# Patient Record
Sex: Female | Born: 1985 | Race: Asian | Hispanic: No | Marital: Married | Smoking: Never smoker
Health system: Southern US, Community
[De-identification: ages and names within clinical notes are randomized; demographics above are authoritative.]

## PROBLEM LIST (undated history)

## (undated) DIAGNOSIS — R519 Headache, unspecified: Secondary | ICD-10-CM

## (undated) DIAGNOSIS — G43909 Migraine, unspecified, not intractable, without status migrainosus: Secondary | ICD-10-CM

## (undated) DIAGNOSIS — M797 Fibromyalgia: Secondary | ICD-10-CM

## (undated) DIAGNOSIS — K219 Gastro-esophageal reflux disease without esophagitis: Secondary | ICD-10-CM

## (undated) DIAGNOSIS — D649 Anemia, unspecified: Secondary | ICD-10-CM

## (undated) DIAGNOSIS — R51 Headache: Secondary | ICD-10-CM

## (undated) DIAGNOSIS — E876 Hypokalemia: Secondary | ICD-10-CM

## (undated) DIAGNOSIS — R140 Abdominal distension (gaseous): Secondary | ICD-10-CM

## (undated) DIAGNOSIS — D696 Thrombocytopenia, unspecified: Secondary | ICD-10-CM

## (undated) DIAGNOSIS — K649 Unspecified hemorrhoids: Secondary | ICD-10-CM

## (undated) DIAGNOSIS — G4700 Insomnia, unspecified: Secondary | ICD-10-CM

## (undated) HISTORY — PX: UPPER GI ENDOSCOPY: SHX6162

## (undated) HISTORY — DX: Thrombocytopenia, unspecified: D69.6

## (undated) HISTORY — DX: Hypokalemia: E87.6

## (undated) HISTORY — DX: Unspecified hemorrhoids: K64.9

## (undated) HISTORY — DX: Migraine, unspecified, not intractable, without status migrainosus: G43.909

## (undated) HISTORY — DX: Anemia, unspecified: D64.9

---

## 2004-05-30 ENCOUNTER — Emergency Department (HOSPITAL_COMMUNITY): Admission: EM | Admit: 2004-05-30 | Discharge: 2004-05-31 | Payer: Self-pay | Admitting: Emergency Medicine

## 2004-10-05 HISTORY — PX: DILATION AND CURETTAGE OF UTERUS: SHX78

## 2005-01-12 ENCOUNTER — Inpatient Hospital Stay (HOSPITAL_COMMUNITY): Admission: AD | Admit: 2005-01-12 | Discharge: 2005-01-12 | Payer: Self-pay | Admitting: Family Medicine

## 2005-03-17 ENCOUNTER — Ambulatory Visit: Payer: Self-pay | Admitting: *Deleted

## 2005-03-17 ENCOUNTER — Ambulatory Visit (HOSPITAL_COMMUNITY): Admission: RE | Admit: 2005-03-17 | Discharge: 2005-03-17 | Payer: Self-pay | Admitting: *Deleted

## 2005-03-19 ENCOUNTER — Encounter: Admission: RE | Admit: 2005-03-19 | Discharge: 2005-06-17 | Payer: Self-pay | Admitting: *Deleted

## 2005-04-23 ENCOUNTER — Ambulatory Visit (HOSPITAL_COMMUNITY): Admission: RE | Admit: 2005-04-23 | Discharge: 2005-04-23 | Payer: Self-pay | Admitting: *Deleted

## 2005-06-25 ENCOUNTER — Inpatient Hospital Stay (HOSPITAL_COMMUNITY): Admission: AD | Admit: 2005-06-25 | Discharge: 2005-06-25 | Payer: Self-pay | Admitting: Obstetrics and Gynecology

## 2005-06-25 ENCOUNTER — Ambulatory Visit: Payer: Self-pay | Admitting: *Deleted

## 2005-08-03 ENCOUNTER — Ambulatory Visit (HOSPITAL_COMMUNITY): Admission: RE | Admit: 2005-08-03 | Discharge: 2005-08-03 | Payer: Self-pay | Admitting: *Deleted

## 2005-08-10 ENCOUNTER — Ambulatory Visit: Payer: Self-pay | Admitting: *Deleted

## 2005-08-17 ENCOUNTER — Ambulatory Visit: Payer: Self-pay | Admitting: Family Medicine

## 2005-08-24 ENCOUNTER — Ambulatory Visit (HOSPITAL_COMMUNITY): Admission: RE | Admit: 2005-08-24 | Discharge: 2005-08-24 | Payer: Self-pay | Admitting: *Deleted

## 2005-08-24 ENCOUNTER — Ambulatory Visit: Payer: Self-pay | Admitting: Obstetrics & Gynecology

## 2005-08-31 ENCOUNTER — Ambulatory Visit: Payer: Self-pay | Admitting: *Deleted

## 2005-08-31 ENCOUNTER — Inpatient Hospital Stay (HOSPITAL_COMMUNITY): Admission: AD | Admit: 2005-08-31 | Discharge: 2005-09-03 | Payer: Self-pay | Admitting: Obstetrics & Gynecology

## 2005-09-01 ENCOUNTER — Encounter (INDEPENDENT_AMBULATORY_CARE_PROVIDER_SITE_OTHER): Payer: Self-pay | Admitting: Specialist

## 2005-09-07 ENCOUNTER — Inpatient Hospital Stay (HOSPITAL_COMMUNITY): Admission: AD | Admit: 2005-09-07 | Discharge: 2005-09-07 | Payer: Self-pay | Admitting: *Deleted

## 2009-04-29 ENCOUNTER — Emergency Department (HOSPITAL_COMMUNITY): Admission: EM | Admit: 2009-04-29 | Discharge: 2009-04-29 | Payer: Self-pay | Admitting: Family Medicine

## 2010-03-18 ENCOUNTER — Emergency Department (HOSPITAL_COMMUNITY): Admission: EM | Admit: 2010-03-18 | Discharge: 2010-03-18 | Payer: Self-pay | Admitting: Emergency Medicine

## 2010-06-03 ENCOUNTER — Encounter: Admission: RE | Admit: 2010-06-03 | Discharge: 2010-06-03 | Payer: Self-pay | Admitting: Infectious Diseases

## 2010-07-08 ENCOUNTER — Ambulatory Visit: Payer: Self-pay | Admitting: Internal Medicine

## 2010-07-08 DIAGNOSIS — IMO0001 Reserved for inherently not codable concepts without codable children: Secondary | ICD-10-CM

## 2010-07-08 DIAGNOSIS — M25519 Pain in unspecified shoulder: Secondary | ICD-10-CM

## 2010-07-08 DIAGNOSIS — R51 Headache: Secondary | ICD-10-CM

## 2010-07-08 DIAGNOSIS — R209 Unspecified disturbances of skin sensation: Secondary | ICD-10-CM

## 2010-07-08 DIAGNOSIS — R519 Headache, unspecified: Secondary | ICD-10-CM | POA: Insufficient documentation

## 2010-07-09 LAB — CONVERTED CEMR LAB: Vit D, 25-Hydroxy: 17 ng/mL — ABNORMAL LOW (ref 30–89)

## 2010-07-10 LAB — CONVERTED CEMR LAB
Sed Rate: 9 mm/hr (ref 0–22)
TSH: 0.87 microintl units/mL (ref 0.35–5.50)
Total CK: 89 units/L (ref 7–177)

## 2010-07-23 ENCOUNTER — Telehealth (INDEPENDENT_AMBULATORY_CARE_PROVIDER_SITE_OTHER): Payer: Self-pay | Admitting: *Deleted

## 2010-11-04 NOTE — Assessment & Plan Note (Signed)
Summary: NEW TO ESTAB///SPH   Vital Signs:  Patient profile:   25 year old female Height:      64 inches Weight:      126.8 pounds BMI:     21.84 Temp:     98.4 degrees F oral Pulse rate:   88 / minute Resp:     14 per minute BP sitting:   104 / 62  (left arm) Cuff size:   large  Vitals Entered By: Shonna Chock CMA (July 08, 2010 1:53 PM) CC: New patient establish: Discuss some symptoms, Headaches, Shoulder pain Comments Patient was taking Zonisamide 25mg  4 by mouth at bedtime and Metaxaline 800mg  1 by mouth 3-4 x as needed. Patient stopped x 1 month ago.   CC:  New patient establish: Discuss some symptoms, Headaches, and Shoulder pain.  History of Present Illness: Headaches      This is a 25 year old woman who presents with  a new type of headaches for 1-2 weeks.  The patient reports nausea, sinus pain, sinus pressure, photophobia, and phonophobia, but denies vomiting, sweats, tearing of eyes, and nasal congestion.  The headache is described as intermittent and dull.  The location of the pain is diffuse.  She also has  neck pain.  The patient denies the following high-risk features: fever, vision loss or change, focal weakness, and pain worse with exertion.  The headaches are precipitated by change in weather.  Prior treatment has included Zonisamide &  Metaxalone from Dr Catalina Lunger , Headache specialist  in Summer of 2011 for  daily headaches ( she had been taking Excedrin Migraine & OTC med with caffeine regularly) with throbbing "all over" with resolution of that type of headache. She stopped these 2 weeks ago because she is considering getting pregnant.No headaches with 1st pregnancy. Shoulder Pain      The patient also presents with Shoulder area  pain for several months.  The patient reports numbness, weakness, and tingling in RUE, but denies locking, swelling, and redness.  The pain is located in the right shoulder .  The pain began gradually and without injury.  The patient describes  the pain as intermittent, sharp to  burning.  The pain is better with NSAIDS ( see comment above concerning chronic daily headaches).  The pain is worse with activity.  PMH of low back treated with response by Chiropractry.  Preventive Screening-Counseling & Management  Alcohol-Tobacco     Smoking Status: never  Caffeine-Diet-Exercise     Does Patient Exercise: no  Current Medications (verified): 1)  None  Allergies (verified): No Known Drug Allergies  Past History:  Past Medical History: Headache, migraines  Past Surgical History: G1 P 1  Denies surgical history  Family History: Father: Living, HTN, High Cholesterol , Gout Mother: Living, colon cancer(resected), migraines Siblings: 3 sisters, 2 brothers. 1 bro : seizures  Social History: Occupation: Agricultural engineer Married Never Smoked Alcohol use-no Regular exercise-no Smoking Status:  never Does Patient Exercise:  no  Physical Exam  General:  well-nourished,in no acute distress; alert,appropriate and cooperative throughout examination Head:  Normocephalic and atraumatic without obvious abnormalities.  Eyes:  No corneal or conjunctival inflammation noted. EOMI. Perrla. Field of Vision grossly normal. Ears:  External ear exam shows no significant lesions or deformities.  Otoscopic examination reveals clear canals, tympanic membranes are intact bilaterally without bulging, retraction, inflammation or discharge. Hearing is grossly normal bilaterally. Nose:  External nasal examination shows no deformity or inflammation. Nasal mucosa are pink and moist  without lesions or exudates. Mouth:  Oral mucosa and oropharynx without lesions or exudates.  Teeth in good repair. Neck:  No deformities, masses, or tenderness noted. Full ROM Lungs:  Normal respiratory effort, chest expands symmetrically. Lungs are clear to auscultation, no crackles or wheezes. Heart:  Normal rate and regular rhythm. S1 and S2 normal without gallop, murmur,  click, rub .S4 Abdomen:  Bowel sounds positive,abdomen soft and non-tender without masses, organomegaly or hernias noted. Msk:  Slight scoliosis suggested; asymmetry of paraspinal muscles. Straightening of upper thoracic spine with loss of normal curvature  Pulses:  R and L carotid,radial,dorsalis pedis and posterior tibial pulses are full and equal bilaterally Extremities:  No clubbing, cyanosis, edema, or deformity noted with normal full range of motion of all joints.   Neurologic:    alert & oriented X3, cranial nerves II-XII intact, strength normal in all extremities, sensation intact to light touch over face , and DTRs symmetrical and normal.  Equivocal Tinel's LUE Skin:  Intact without suspicious lesions or rashes Cervical Nodes:  No lymphadenopathy noted Axillary Nodes:  No palpable lymphadenopathy Psych:  memory intact for recent and remote, normally interactive, and good eye contact.     Impression & Recommendations:  Problem # 1:  HEADACHE (ICD-784.0)  PMH of migraine & chronic daily headaches  Orders: Venipuncture (04540)  Her updated medication list for this problem includes:    Tramadol Hcl 50 Mg Tabs (Tramadol hcl) .Marland Kitchen... 1 every 6 hrs as needed for muscle pain  Problem # 2:  SHOULDER PAIN, RIGHT (ICD-719.41)  probably related to spinal curvature issues  Orders: T-Cervicle Spine 2-3 Views (98119JY) T-Thoracic Spine 2 Views (78295AO) Venipuncture (13086) TLB-Sedimentation Rate (ESR) (85652-ESR) Specimen Handling (57846)  Her updated medication list for this problem includes:    Tramadol Hcl 50 Mg Tabs (Tramadol hcl) .Marland Kitchen... 1 every 6 hrs as needed for muscle pain  Problem # 3:  PARESTHESIA (ICD-782.0)  possible cervical radiculopthy   Orders: T-Cervicle Spine 2-3 Views (96295MW) T-Thoracic Spine 2 Views (41324MW) Venipuncture (10272) TLB-TSH (Thyroid Stimulating Hormone) (84443-TSH) Specimen Handling (53664)  Complete Medication List: 1)  Tramadol Hcl 50 Mg  Tabs (Tramadol hcl) .Marland Kitchen.. 1 every 6 hrs as needed for muscle pain  Other Orders: TLB-CK Total Only(Creatine Kinase/CPK) (82550-CK) T-Vitamin D (25-Hydroxy) (40347-42595)  Patient Instructions: 1)  Discuss  your plans for pregnancy with your Ob AND the Headache Specialist to minimize risks. Prescriptions: TRAMADOL HCL 50 MG TABS (TRAMADOL HCL) 1 every 6 hrs as needed for muscle pain  #30 x 0   Entered and Authorized by:   Marga Melnick MD   Signed by:   Marga Melnick MD on 07/08/2010   Method used:   Print then Give to Patient   RxID:   [id number]

## 2010-11-04 NOTE — Progress Notes (Signed)
Summary: Vit D Rx  Phone Note From Pharmacy   Caller: Fax from [facility] [address] Summary of Call: Fax states: Patient needs a rx for Vitamin D. Please advise.  Initial call taken by: Harold Barban,  July 23, 2010 8:17 AM  Follow-up for Phone Call        I spoke with patient and informed her that the dose of Vit D that Dr.Hopper recommended is OTC, patient ok'd  Follow-up by: Shonna Chock CMA,  July 23, 2010 8:45 AM

## 2011-01-11 LAB — POCT URINALYSIS DIP (DEVICE)
Bilirubin Urine: NEGATIVE
Glucose, UA: NEGATIVE mg/dL
Hgb urine dipstick: NEGATIVE
Ketones, ur: NEGATIVE mg/dL
Nitrite: NEGATIVE
Protein, ur: NEGATIVE mg/dL
Specific Gravity, Urine: 1.01 (ref 1.005–1.030)
Urobilinogen, UA: 0.2 mg/dL (ref 0.0–1.0)
pH: 6 (ref 5.0–8.0)

## 2011-01-11 LAB — POCT I-STAT, CHEM 8
BUN: 12 mg/dL (ref 6–23)
Calcium, Ion: 1.24 mmol/L (ref 1.12–1.32)
Chloride: 104 mEq/L (ref 96–112)
Creatinine, Ser: 0.8 mg/dL (ref 0.4–1.2)
Glucose, Bld: 108 mg/dL — ABNORMAL HIGH (ref 70–99)
HCT: 44 % (ref 36.0–46.0)
Hemoglobin: 15 g/dL (ref 12.0–15.0)
Potassium: 4.2 mEq/L (ref 3.5–5.1)
Sodium: 139 mEq/L (ref 135–145)
TCO2: 26 mmol/L (ref 0–100)

## 2011-01-11 LAB — DIFFERENTIAL
Basophils Absolute: 0 10*3/uL (ref 0.0–0.1)
Basophils Relative: 1 % (ref 0–1)
Eosinophils Absolute: 0.1 10*3/uL (ref 0.0–0.7)
Eosinophils Relative: 2 % (ref 0–5)
Lymphocytes Relative: 40 % (ref 12–46)
Lymphs Abs: 1.9 10*3/uL (ref 0.7–4.0)
Monocytes Absolute: 0.2 10*3/uL (ref 0.1–1.0)
Monocytes Relative: 4 % (ref 3–12)
Neutro Abs: 2.6 10*3/uL (ref 1.7–7.7)
Neutrophils Relative %: 54 % (ref 43–77)

## 2011-01-11 LAB — TSH: TSH: 1.939 u[IU]/mL (ref 0.350–4.500)

## 2011-01-11 LAB — CBC
HCT: 41.5 % (ref 36.0–46.0)
Hemoglobin: 13.6 g/dL (ref 12.0–15.0)
MCHC: 32.8 g/dL (ref 30.0–36.0)
MCV: 82.7 fL (ref 78.0–100.0)
Platelets: 139 10*3/uL — ABNORMAL LOW (ref 150–400)
RBC: 5.03 MIL/uL (ref 3.87–5.11)
RDW: 13 % (ref 11.5–15.5)
WBC: 4.8 10*3/uL (ref 4.0–10.5)

## 2011-01-11 LAB — POCT PREGNANCY, URINE: Preg Test, Ur: NEGATIVE

## 2011-02-20 NOTE — Discharge Summary (Signed)
Tonya [name], Tonya [name]                  ACCOUNT NO.:  [id number]   MEDICAL RECORD NO.:  [id number]          PATIENT TYPE:  INP   LOCATION:  9125                          FACILITY:  [facility]   PHYSICIAN:  Lesly Dukes, M.D. DATE OF BIRTH:  [birth year]   DATE OF ADMISSION:  08/31/2005  DATE OF DISCHARGE:  09/03/2005                                 DISCHARGE SUMMARY   DISCHARGE DIAGNOSES:  1.  Intrauterine pregnancy term, delivered by normal spontaneous vaginal      delivery at 38 and 6 weeks.  2.  Retained products postpartum.  3.  Status post a dilatation and evacuation on September 01, 2005.  4.  Hemorrhoids.  5.  Constipation.   DISCHARGE MEDICATIONS:  1.  Ibuprofen 600 mg take one tablet q.6h. by mouth as needed for pain.  2.  Colace 100 mg take one tablet by mouth twice daily as needed for      constipation.  3.  Prenatal vitamins take one tablet by mouth daily while breast-feeding.  4.  Proctofoam HC per instructions for postpartum hemorrhoids.   This is a 25 year old G1 who presented at 39 weeks complaining of  contractions every two minutes and was admitted to L&D for active labor.  Patient went on to a normal spontaneous vaginal delivery under epidural  anesthesia with patient delivering a viable female with Apgars of 9 and 9.  Placenta was delivered spontaneously with a three vessel cord.  There were  some noted retained membranes which were ring forceps out as well as had a  bimanual and physical examination with an evacuation performed by Dr.  Mayford Knife.  Patient was noted her bleeding to be stopped and was doing well  status post delivery and was moved to mother/baby.  On postpartum day one  patient developed a fever of 101 and was started on gentamicin and  clindamycin.  Patient was noted to have fever increasing up to a Tmax of  104.  Due to her history of having retained products.  Patient was consented  and taken for a D&E on September 01, 2005 by Dr. Gavin Potters for retained  products of conception.  Please see the dictated op note on September 01, 2005.  Products of conception were removed adequately.  The patient was then  transferred to adult ICU for further care.  Patient did well and remained  afebrile status post procedure.  The patient's pain was well controlled as  well as her bleeding.  Patient received Methergine, Hemabate, and Pitocin  during the postoperative period.  Patient was then transferred to  mother/baby on November __________  and has done well, has remained  afebrile.  Her pain has been well tolerated.  Patient is noted to have  hemorrhoids as well as having some gas and constipation-related pain.  Patient was given stool softeners as well as a stimulant laxative prior to  discharge.  Patient will be receiving Depo Provera for contraception.  Patient will be breast-feeding her female infant.  Patient is  rubella non-immune and receive rubella vaccination prior to discharge.  Patient is  RPR negative, GBS negative.  Blood type __________ .  Patient has  reached maximum benefit of this hospitalization and will follow up in six  weeks at [facility].      Barth Kirks, M.D.    ______________________________  Lesly Dukes, M.D.    MB/MEDQ  D:  09/03/2005  T:  09/03/2005  Job:  [id number]

## 2011-02-20 NOTE — Op Note (Signed)
NAMEBAYLIN, [name]                  ACCOUNT NO.:  [id number]   MEDICAL RECORD NO.:  [id number]          PATIENT TYPE:  INP   LOCATION:  9125                          FACILITY:  [facility]   PHYSICIAN:  Conni Elliot, M.D.DATE OF BIRTH:  09-Feb-1986   DATE OF PROCEDURE:  09/01/2005  DATE OF DISCHARGE:  09/03/2005                                 OPERATIVE REPORT   PREOPERATIVE DIAGNOSIS:  Retained products of conception.   POSTOPERATIVE DIAGNOSIS:  Retained products of conception.   OPERATION:  Dilatation and evacuation.   OPERATOR:  Conni Elliot, M.D.   ANESTHESIA:  MAC.   PROCEDURE:  After placing the patient under MAC anesthesia, the patient was  supine in the dorsal supine position.  Perineum and vagina were prepped with  a Betadine solution.  A weighted speculum was placed in the posterior  vagina.  The anterior cervix was grasped.  The uterus was evacuated with  suction and sharp curettage.  Retained products of conception were  identified and sent to pathology.  Instrument and sponge count correct.           ______________________________  Conni Elliot, M.D.     ASG/MEDQ  D:  10/15/2005  T:  10/15/2005  Job:  [id number]

## 2011-07-17 ENCOUNTER — Encounter: Payer: Self-pay | Admitting: Gastroenterology

## 2011-07-17 ENCOUNTER — Inpatient Hospital Stay (INDEPENDENT_AMBULATORY_CARE_PROVIDER_SITE_OTHER)
Admission: RE | Admit: 2011-07-17 | Discharge: 2011-07-17 | Disposition: A | Discharge status: Home / Self Care | Payer: BC Managed Care – PPO | Source: Ambulatory Visit | Attending: Emergency Medicine | Admitting: Emergency Medicine

## 2011-07-17 ENCOUNTER — Emergency Department (HOSPITAL_COMMUNITY)
Admission: EM | Admit: 2011-07-17 | Discharge: 2011-07-18 | Disposition: A | Discharge status: Home / Self Care | Payer: BC Managed Care – PPO | Attending: Emergency Medicine | Admitting: Emergency Medicine

## 2011-07-17 DIAGNOSIS — R109 Unspecified abdominal pain: Secondary | ICD-10-CM

## 2011-07-17 DIAGNOSIS — R142 Eructation: Secondary | ICD-10-CM | POA: Insufficient documentation

## 2011-07-17 DIAGNOSIS — R1032 Left lower quadrant pain: Secondary | ICD-10-CM | POA: Insufficient documentation

## 2011-07-17 DIAGNOSIS — K5900 Constipation, unspecified: Secondary | ICD-10-CM | POA: Insufficient documentation

## 2011-07-17 DIAGNOSIS — R141 Gas pain: Secondary | ICD-10-CM | POA: Insufficient documentation

## 2011-07-17 DIAGNOSIS — R110 Nausea: Secondary | ICD-10-CM | POA: Insufficient documentation

## 2011-07-17 DIAGNOSIS — K644 Residual hemorrhoidal skin tags: Secondary | ICD-10-CM | POA: Insufficient documentation

## 2011-07-17 LAB — URINALYSIS, ROUTINE W REFLEX MICROSCOPIC
Bilirubin Urine: NEGATIVE
Glucose, UA: NEGATIVE mg/dL
Hgb urine dipstick: NEGATIVE
Ketones, ur: NEGATIVE mg/dL
Leukocytes, UA: NEGATIVE
Nitrite: NEGATIVE
Protein, ur: NEGATIVE mg/dL
Specific Gravity, Urine: 1.019 (ref 1.005–1.030)
Urobilinogen, UA: 0.2 mg/dL (ref 0.0–1.0)
pH: 7 (ref 5.0–8.0)

## 2011-07-17 LAB — POCT PREGNANCY, URINE
Preg Test, Ur: NEGATIVE
Preg Test, Ur: NEGATIVE

## 2011-07-17 LAB — POCT I-STAT, CHEM 8
BUN: 10 mg/dL (ref 6–23)
Calcium, Ion: 1.2 mmol/L (ref 1.12–1.32)
Chloride: 102 mEq/L (ref 96–112)
Creatinine, Ser: 0.9 mg/dL (ref 0.50–1.10)
Glucose, Bld: 99 mg/dL (ref 70–99)
HCT: 40 % (ref 36.0–46.0)
Hemoglobin: 13.6 g/dL (ref 12.0–15.0)
Potassium: 4.2 mEq/L (ref 3.5–5.1)
Sodium: 139 mEq/L (ref 135–145)
TCO2: 26 mmol/L (ref 0–100)

## 2011-07-17 LAB — POCT URINALYSIS DIP (DEVICE)
Bilirubin Urine: NEGATIVE
Glucose, UA: NEGATIVE mg/dL
Hgb urine dipstick: NEGATIVE
Ketones, ur: NEGATIVE mg/dL
Leukocytes, UA: NEGATIVE
Nitrite: NEGATIVE
Protein, ur: NEGATIVE mg/dL
Specific Gravity, Urine: 1.015 (ref 1.005–1.030)
Urobilinogen, UA: 0.2 mg/dL (ref 0.0–1.0)
pH: 8.5 — ABNORMAL HIGH (ref 5.0–8.0)

## 2011-07-18 ENCOUNTER — Emergency Department (HOSPITAL_COMMUNITY): Payer: BC Managed Care – PPO

## 2011-07-18 LAB — WET PREP, GENITAL
Trich, Wet Prep: NONE SEEN
Yeast Wet Prep HPF POC: NONE SEEN

## 2011-07-18 LAB — OCCULT BLOOD, POC DEVICE: Fecal Occult Bld: NEGATIVE

## 2011-07-20 LAB — GC/CHLAMYDIA PROBE AMP, GENITAL
Chlamydia, DNA Probe: NEGATIVE
GC Probe Amp, Genital: NEGATIVE

## 2011-08-19 ENCOUNTER — Ambulatory Visit (INDEPENDENT_AMBULATORY_CARE_PROVIDER_SITE_OTHER): Payer: BC Managed Care – PPO | Admitting: Gastroenterology

## 2011-08-19 ENCOUNTER — Encounter: Payer: Self-pay | Admitting: Gastroenterology

## 2011-08-19 DIAGNOSIS — K5900 Constipation, unspecified: Secondary | ICD-10-CM | POA: Insufficient documentation

## 2011-08-19 DIAGNOSIS — K648 Other hemorrhoids: Secondary | ICD-10-CM | POA: Insufficient documentation

## 2011-08-19 DIAGNOSIS — Z800 Family history of malignant neoplasm of digestive organs: Secondary | ICD-10-CM | POA: Insufficient documentation

## 2011-08-19 MED ORDER — HYDROCORTISONE ACETATE 25 MG RE SUPP: 25.0000 mg | Freq: Two times a day (BID) | RECTAL | Status: AC

## 2011-08-19 MED ORDER — HYOSCYAMINE SULFATE ER 0.375 MG PO TBCR: EXTENDED_RELEASE_TABLET | ORAL | Status: DC

## 2011-08-19 NOTE — Patient Instructions (Signed)
Research has spoke with you today

## 2011-08-19 NOTE — Progress Notes (Signed)
Tonya [name] is a pleasant 25 year old Asian female referred at the request of Dr. Alwyn Ren for evaluation of abdominal pain and constipation. Constipation has been a chronic problem. She can go 5-7 days without bowel movement. With constipation she has abdominal bloating, discomfort and nausea. She complains of protrusion of hemorrhoids with straining. With constipation she has loss of appetite and complains of heartburn.  Family history is pertinent for her mother who had colon cancer at 28.      Past Medical History  Diagnosis Date  . Migraines    History reviewed. No pertinent past surgical history. family history includes Colon cancer in her mother; Gout in her father; Hyperlipidemia in her father; Hypertension in her father; Irritable bowel syndrome in her maternal grandmother and mother; Migraines in her mother; and Seizures in her brother. Current Outpatient Prescriptions  Medication Sig Dispense Refill  . cholecalciferol (VITAMIN D) 1000 UNITS tablet Take 1,000 Units by mouth as needed.        . senna-docusate (SENOKOT-S) 8.6-50 MG per tablet Take 1 tablet by mouth as needed.         Allergies as of 08/19/2011  . (No Known Allergies)    reports that she has never smoked. She has never used smokeless tobacco. She reports that she does not drink alcohol or use illicit drugs.     Review of Systems: Pertinent positive and negative review of systems were noted in the above HPI section. All other review of systems were otherwise negative.  Vital signs were reviewed in today's medical record Physical Exam: General: Well developed , well nourished, no acute distress Head: Normocephalic and atraumatic Eyes:  sclerae anicteric, EOMI Ears: Normal auditory acuity Mouth: No deformity or lesions Neck: Supple, no masses or thyromegaly Lungs: Clear throughout to auscultation Heart: Regular rate and rhythm; no murmurs, rubs or bruits Abdomen: Soft,  and non distended. No masses,  hepatosplenomegaly or hernias noted. Normal Bowel sounds; there is minimal tenderness in the left lower quadrant Rectal:deferred Musculoskeletal: Symmetrical with no gross deformities  Skin: No lesions on visible extremities Pulses:  Normal pulses noted Extremities: No clubbing, cyanosis, edema or deformities noted Neurological: Alert oriented x 4, grossly nonfocal Cervical Nodes:  No significant cervical adenopathy Inguinal Nodes: No significant inguinal adenopathy Psychological:  Alert and cooperative. Normal mood and affect

## 2011-08-19 NOTE — Assessment & Plan Note (Signed)
Plan to begin screening colonoscopy at age 25

## 2011-08-19 NOTE — Assessment & Plan Note (Addendum)
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  Recommendations #1 the patient will consider enrollment in the constipation trial; should she not participate I will place her on a regimen of MiraLax and high-fiber

## 2011-08-19 NOTE — Assessment & Plan Note (Addendum)
I explained to the patient that any therapy for hemorrhoids was boung to be unsuccessful until her constipation is resolved.  Recommendations #1 Anusol HC suppositories

## 2012-05-20 IMAGING — CR DG ABDOMEN 1V
1 series · 1 of 1 positions shown · non-contrast
Comparison: None.

CLINICAL DATA: Left-sided abdominal pain, nausea, and constipation.

ABDOMEN - 1 VIEW

[t abdomen supine]
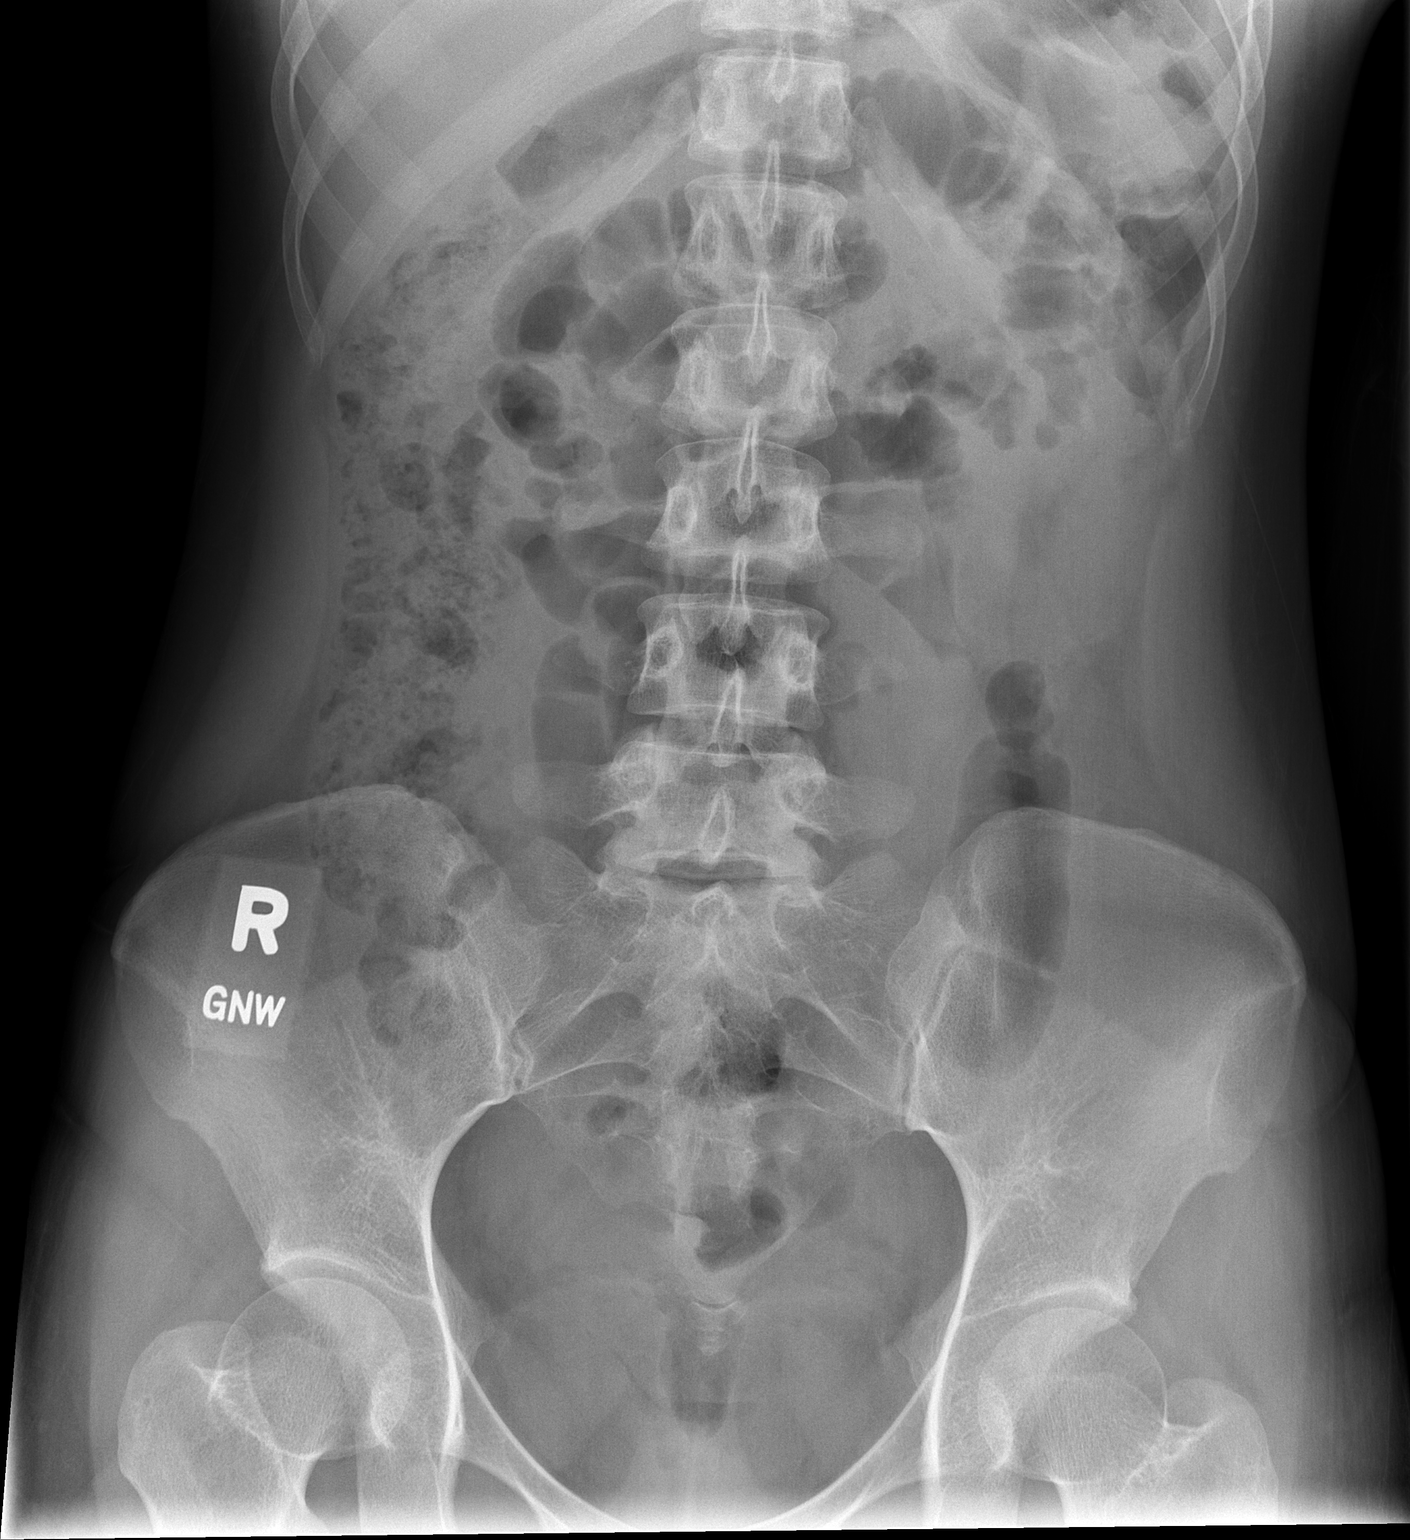

[1 of 1 positions shown; findings below may reference images not displayed]

FINDINGS: Normal bowel gas pattern with scattered gas and stool in
the colon.  Nondistended gas-filled small bowel loops.  No
radiopaque stones are demonstrated.  Visualized bones appear
intact.
IMPRESSION: Nonobstructive bowel gas pattern.

## 2012-10-05 NOTE — L&D Delivery Note (Signed)
Delivery Note At 12:16 PM a viable and healthy female was delivered via Vaginal, Spontaneous Delivery (Presentation: Left Occiput Anterior).  APGAR: 8, 9; weight pending.   Placenta status: Intact, Spontaneous.  Cord: 3 vessels   Anesthesia: None  Episiotomy: None Lacerations: None Suture Repair: none Est. Blood Loss (mL): 300  Mom to postpartum.  Baby to nursery-stable.  Lasharon Dunivan H. 01/03/2013, 12:28 PM

## 2012-12-12 LAB — OB RESULTS CONSOLE GBS: GBS: POSITIVE

## 2013-01-03 ENCOUNTER — Inpatient Hospital Stay (HOSPITAL_COMMUNITY)
Admission: AD | Admit: 2013-01-03 | Discharge: 2013-01-05 | DRG: 373 | Disposition: A | Discharge status: Home / Self Care | Payer: BC Managed Care – PPO | Source: Ambulatory Visit | Attending: Obstetrics and Gynecology | Admitting: Obstetrics and Gynecology

## 2013-01-03 ENCOUNTER — Encounter (HOSPITAL_COMMUNITY): Payer: Self-pay

## 2013-01-03 DIAGNOSIS — O99892 Other specified diseases and conditions complicating childbirth: Principal | ICD-10-CM | POA: Diagnosis present

## 2013-01-03 DIAGNOSIS — Z22330 Carrier of Group B streptococcus: Secondary | ICD-10-CM

## 2013-01-03 LAB — TYPE AND SCREEN: ABO/RH(D): O POS

## 2013-01-03 LAB — OB RESULTS CONSOLE ABO/RH: RH Type: POSITIVE

## 2013-01-03 LAB — RPR: RPR Ser Ql: NONREACTIVE

## 2013-01-03 LAB — OB RESULTS CONSOLE RUBELLA ANTIBODY, IGM: Rubella: IMMUNE

## 2013-01-03 LAB — ABO/RH: ABO/RH(D): O POS

## 2013-01-03 LAB — CBC
MCHC: 32.6 g/dL (ref 30.0–36.0)
RDW: 13.4 % (ref 11.5–15.5)

## 2013-01-03 LAB — OB RESULTS CONSOLE ANTIBODY SCREEN: Antibody Screen: NEGATIVE

## 2013-01-03 LAB — OB RESULTS CONSOLE GC/CHLAMYDIA: Gonorrhea: NEGATIVE

## 2013-01-03 MED ORDER — SENNOSIDES-DOCUSATE SODIUM 8.6-50 MG PO TABS
2.0000 | ORAL_TABLET | Freq: Every day | ORAL | Status: DC
Start: 2013-01-03 — End: 2013-01-05
  Administered 2013-01-03 – 2013-01-05 (×2): 2 via ORAL

## 2013-01-03 MED ORDER — LIDOCAINE HCL (PF) 1 % IJ SOLN
30.0000 mL | INTRAMUSCULAR | Status: DC | PRN
  Filled 2013-01-03 (×2): qty 30

## 2013-01-03 MED ORDER — LACTATED RINGERS IV SOLN
INTRAVENOUS | Status: DC
  Administered 2013-01-03: 11:00:00 via INTRAVENOUS

## 2013-01-03 MED ORDER — DIPHENHYDRAMINE HCL 25 MG PO CAPS: 25.0000 mg | ORAL_CAPSULE | Freq: Four times a day (QID) | ORAL | Status: DC | PRN

## 2013-01-03 MED ORDER — FLEET ENEMA 7-19 GM/118ML RE ENEM: 1.0000 | ENEMA | RECTAL | Status: DC | PRN

## 2013-01-03 MED ORDER — BUTORPHANOL TARTRATE 1 MG/ML IJ SOLN
1.0000 mg | Freq: Once | INTRAMUSCULAR | Status: AC
  Administered 2013-01-03: 1 mg via INTRAVENOUS
  Filled 2013-01-03: qty 1

## 2013-01-03 MED ORDER — METHYLERGONOVINE MALEATE 0.2 MG PO TABS: 0.2000 mg | ORAL_TABLET | ORAL | Status: DC | PRN

## 2013-01-03 MED ORDER — OXYTOCIN 40 UNITS IN LACTATED RINGERS INFUSION - SIMPLE MED
62.5000 mL/h | INTRAVENOUS | Status: DC
  Administered 2013-01-03: 62.5 mL/h via INTRAVENOUS
  Filled 2013-01-03: qty 1000

## 2013-01-03 MED ORDER — IBUPROFEN 600 MG PO TABS
600.0000 mg | ORAL_TABLET | Freq: Four times a day (QID) | ORAL | Status: DC | PRN
  Administered 2013-01-03: 600 mg via ORAL
  Filled 2013-01-03: qty 1

## 2013-01-03 MED ORDER — IBUPROFEN 600 MG PO TABS
600.0000 mg | ORAL_TABLET | Freq: Four times a day (QID) | ORAL | Status: DC
  Administered 2013-01-03 – 2013-01-05 (×8): 600 mg via ORAL
  Filled 2013-01-03 (×8): qty 1

## 2013-01-03 MED ORDER — DIPHENHYDRAMINE HCL 50 MG/ML IJ SOLN: 12.5000 mg | INTRAMUSCULAR | Status: DC | PRN

## 2013-01-03 MED ORDER — EPHEDRINE 5 MG/ML INJ
10.0000 mg | INTRAVENOUS | Status: DC | PRN
Start: 2013-01-03 — End: 2013-01-03
  Filled 2013-01-03: qty 2

## 2013-01-03 MED ORDER — PRENATAL MULTIVITAMIN CH
1.0000 | ORAL_TABLET | Freq: Every day | ORAL | Status: DC
  Administered 2013-01-04 – 2013-01-05 (×2): 1 via ORAL
  Filled 2013-01-03 (×2): qty 1

## 2013-01-03 MED ORDER — EPHEDRINE 5 MG/ML INJ
10.0000 mg | INTRAVENOUS | Status: DC | PRN
  Filled 2013-01-03: qty 2

## 2013-01-03 MED ORDER — ACETAMINOPHEN 325 MG PO TABS: 650.0000 mg | ORAL_TABLET | ORAL | Status: DC | PRN

## 2013-01-03 MED ORDER — LACTATED RINGERS IV SOLN: 500.0000 mL | INTRAVENOUS | Status: DC | PRN

## 2013-01-03 MED ORDER — METHYLERGONOVINE MALEATE 0.2 MG/ML IJ SOLN: 0.2000 mg | INTRAMUSCULAR | Status: DC | PRN

## 2013-01-03 MED ORDER — OXYCODONE-ACETAMINOPHEN 5-325 MG PO TABS: 1.0000 | ORAL_TABLET | ORAL | Status: DC | PRN

## 2013-01-03 MED ORDER — CITRIC ACID-SODIUM CITRATE 334-500 MG/5ML PO SOLN: 30.0000 mL | ORAL | Status: DC | PRN

## 2013-01-03 MED ORDER — FENTANYL 2.5 MCG/ML BUPIVACAINE 1/10 % EPIDURAL INFUSION (WH - ANES): 14.0000 mL/h | INTRAMUSCULAR | Status: DC | PRN

## 2013-01-03 MED ORDER — ZOLPIDEM TARTRATE 5 MG PO TABS: 5.0000 mg | ORAL_TABLET | Freq: Every evening | ORAL | Status: DC | PRN

## 2013-01-03 MED ORDER — OXYCODONE-ACETAMINOPHEN 5-325 MG PO TABS
1.0000 | ORAL_TABLET | ORAL | Status: DC | PRN
  Administered 2013-01-05: 1 via ORAL
  Filled 2013-01-03: qty 1

## 2013-01-03 MED ORDER — DIBUCAINE 1 % RE OINT: 1.0000 "application " | TOPICAL_OINTMENT | RECTAL | Status: DC | PRN

## 2013-01-03 MED ORDER — ONDANSETRON HCL 4 MG PO TABS: 4.0000 mg | ORAL_TABLET | ORAL | Status: DC | PRN

## 2013-01-03 MED ORDER — PHENYLEPHRINE 40 MCG/ML (10ML) SYRINGE FOR IV PUSH (FOR BLOOD PRESSURE SUPPORT)
80.0000 ug | PREFILLED_SYRINGE | INTRAVENOUS | Status: DC | PRN
  Filled 2013-01-03: qty 2

## 2013-01-03 MED ORDER — LACTATED RINGERS IV SOLN: 500.0000 mL | Freq: Once | INTRAVENOUS | Status: DC

## 2013-01-03 MED ORDER — BENZOCAINE-MENTHOL 20-0.5 % EX AERO
1.0000 "application " | INHALATION_SPRAY | CUTANEOUS | Status: DC | PRN
  Administered 2013-01-03: 1 via TOPICAL
  Filled 2013-01-03: qty 56

## 2013-01-03 MED ORDER — SODIUM CHLORIDE 0.9 % IV SOLN
2.0000 g | Freq: Once | INTRAVENOUS | Status: AC
  Administered 2013-01-03: 2 g via INTRAVENOUS
  Filled 2013-01-03: qty 2000

## 2013-01-03 MED ORDER — ONDANSETRON HCL 4 MG/2ML IJ SOLN: 4.0000 mg | INTRAMUSCULAR | Status: DC | PRN

## 2013-01-03 MED ORDER — HYDROCORTISONE ACE-PRAMOXINE 1-1 % RE FOAM
1.0000 | Freq: Two times a day (BID) | RECTAL | Status: DC
  Administered 2013-01-03: 1 via RECTAL
  Filled 2013-01-03: qty 10

## 2013-01-03 MED ORDER — WITCH HAZEL-GLYCERIN EX PADS
1.0000 "application " | MEDICATED_PAD | CUTANEOUS | Status: DC | PRN
  Administered 2013-01-03: 1 via TOPICAL

## 2013-01-03 MED ORDER — TETANUS-DIPHTH-ACELL PERTUSSIS 5-2.5-18.5 LF-MCG/0.5 IM SUSP
0.5000 mL | Freq: Once | INTRAMUSCULAR | Status: AC
  Administered 2013-01-03: 0.5 mL via INTRAMUSCULAR

## 2013-01-03 MED ORDER — SIMETHICONE 80 MG PO CHEW: 80.0000 mg | CHEWABLE_TABLET | ORAL | Status: DC | PRN

## 2013-01-03 MED ORDER — ONDANSETRON HCL 4 MG/2ML IJ SOLN: 4.0000 mg | Freq: Four times a day (QID) | INTRAMUSCULAR | Status: DC | PRN

## 2013-01-03 MED ORDER — LANOLIN HYDROUS EX OINT: TOPICAL_OINTMENT | CUTANEOUS | Status: DC | PRN

## 2013-01-03 MED ORDER — OXYTOCIN BOLUS FROM INFUSION: 500.0000 mL | INTRAVENOUS | Status: DC

## 2013-01-03 NOTE — MAU Note (Signed)
Pt states ctx's since 0400, started timing at 0500, denies bleeding or lof, was 3cm in office at last visit.

## 2013-01-03 NOTE — Progress Notes (Signed)
Dr Fredia Sorrow notified of patient, tracing, ctx pattern, sve result and positive GBS. Order to admit. Patient may have epidural, one dose of stadol 1mg  and 2gram ampicillin.

## 2013-01-03 NOTE — H&P (Signed)
Tonya [name] is a 27 y.o. female presenting for labor  27 yo G2P1001 @ 38+6 presents for contractions and is found to be in labor. Her pregnancy has been uncomplicated to this point. History OB History   Grav Para Term Preterm Abortions TAB SAB Ect Mult Living   2 1 1       1      Past Medical History  Diagnosis Date  . Migraines    Past Surgical History  Procedure Laterality Date  . No past surgeries     Family History: family history includes Colon cancer in her mother; Gout in her father; Hyperlipidemia in her father; Hypertension in her father; Irritable bowel syndrome in her maternal grandmother and mother; Migraines in her mother; and Seizures in her brother. Social History:  reports that she has never smoked. She has never used smokeless tobacco. She reports that she does not drink alcohol or use illicit drugs.   Prenatal Transfer Tool  Maternal Diabetes: No Genetic Screening: Declined Maternal Ultrasounds/Referrals: Abnormal:  Findings:   Isolated EIF (echogenic intracardiac focus) Fetal Ultrasounds or other Referrals:  None Maternal Substance Abuse:  No Significant Maternal Medications:  None Significant Maternal Lab Results:  None Other Comments:  None  ROS: as above  Dilation: Lip/rim Effacement (%): 100 Station: +1 Exam by:: L. Lima, RN Blood pressure 112/80, pulse 82, temperature 98 F (36.7 C), temperature source Oral, resp. rate 18, height 5' 3.75" (1.619 m), weight 71.385 kg (157 lb 6 oz). Exam Physical Exam  Prenatal labs: ABO, Rh: O/Positive/-- (04/01 0000) Antibody: Negative (04/01 0000) Rubella: Immune (04/01 0000) RPR: Nonreactive (04/01 0000)  HBsAg: Negative (04/01 0000)  HIV: Non-reactive (04/01 0000)  GBS: Positive (03/10 0000)   Assessment/Plan: 1) Admit 2) Epidural on request 3) Anticipate svd  Sinclair Arrazola H. 01/03/2013, 12:29 PM

## 2013-01-04 LAB — CBC
MCH: 25.5 pg — ABNORMAL LOW (ref 26.0–34.0)
MCHC: 32.7 g/dL (ref 30.0–36.0)
Platelets: 97 10*3/uL — ABNORMAL LOW (ref 150–400)
RBC: 3.8 MIL/uL — ABNORMAL LOW (ref 3.87–5.11)
RDW: 13.4 % (ref 11.5–15.5)

## 2013-01-04 NOTE — Progress Notes (Signed)
Platelets 97.  Notified Dr. Dareen Piano to be sure patient could still receive Ibuprofen.  Dr. Dareen Piano ordered that ibuprofen may still be given.  Will continue to monitor patient.  Earl Gala, Linda Hedges Marissa

## 2013-01-04 NOTE — Progress Notes (Signed)
PPD#1 Pt without complaints. Mother and baby are doing well. VSSAF IMP/ stable PLAN/ routine care/

## 2013-01-05 MED ORDER — OXYCODONE-ACETAMINOPHEN 5-325 MG PO TABS: 1.0000 | ORAL_TABLET | ORAL | Status: DC | PRN

## 2013-01-05 NOTE — Discharge Summary (Signed)
Obstetric Discharge Summary Reason for Admission: onset of labor Prenatal Procedures: none Intrapartum Procedures: spontaneous vaginal delivery Postpartum Procedures: none Complications-Operative and Postpartum: none Hemoglobin  Date Value Range Status  01/04/2013 9.7* 12.0 - 15.0 g/dL Final     REPEATED TO VERIFY     DELTA CHECK NOTED     HCT  Date Value Range Status  01/04/2013 29.7* 36.0 - 46.0 % Final    Physical Exam:  General: alert and cooperative Lochia: appropriate Uterine Fundus: firm DVT Evaluation: No evidence of DVT seen on physical exam.  Discharge Diagnoses: Term Pregnancy-delivered  Discharge Information: Date: 01/05/2013 Activity: pelvic rest Diet: routine Medications: PNV, Ibuprofen, Iron and Percocet Condition: stable Instructions: refer to practice specific booklet Discharge to: home Follow-up Information   Follow up with Almon Hercules., MD In 4 weeks.   Contact information:   [address] [place] [address] [phone]       Newborn Data: Live born female  Birth Weight: 6 lb 4.5 oz (2850 g) APGAR: 8, 9  Home with mother.  Tonya [name] 01/05/2013, 10:19 AM

## 2014-03-27 ENCOUNTER — Emergency Department (INDEPENDENT_AMBULATORY_CARE_PROVIDER_SITE_OTHER): Payer: BC Managed Care – PPO

## 2014-03-27 ENCOUNTER — Emergency Department (HOSPITAL_COMMUNITY)
Admission: EM | Admit: 2014-03-27 | Discharge: 2014-03-27 | Disposition: A | Discharge status: Nursing Home (Includes ICF) | Payer: BC Managed Care – PPO | Source: Home / Self Care | Attending: Family Medicine | Admitting: Family Medicine

## 2014-03-27 ENCOUNTER — Encounter (HOSPITAL_COMMUNITY): Payer: Self-pay | Admitting: Emergency Medicine

## 2014-03-27 ENCOUNTER — Inpatient Hospital Stay (HOSPITAL_COMMUNITY)
Admission: EM | Admit: 2014-03-27 | Discharge: 2014-03-29 | DRG: 313 | Disposition: A | Discharge status: Home / Self Care | Payer: BC Managed Care – PPO | Attending: Internal Medicine | Admitting: Internal Medicine

## 2014-03-27 DIAGNOSIS — G43909 Migraine, unspecified, not intractable, without status migrainosus: Secondary | ICD-10-CM | POA: Diagnosis present

## 2014-03-27 DIAGNOSIS — Z800 Family history of malignant neoplasm of digestive organs: Secondary | ICD-10-CM

## 2014-03-27 DIAGNOSIS — R5383 Other fatigue: Secondary | ICD-10-CM

## 2014-03-27 DIAGNOSIS — D61818 Other pancytopenia: Secondary | ICD-10-CM | POA: Diagnosis present

## 2014-03-27 DIAGNOSIS — R0789 Other chest pain: Secondary | ICD-10-CM

## 2014-03-27 DIAGNOSIS — R110 Nausea: Secondary | ICD-10-CM | POA: Diagnosis present

## 2014-03-27 DIAGNOSIS — K219 Gastro-esophageal reflux disease without esophagitis: Secondary | ICD-10-CM | POA: Diagnosis present

## 2014-03-27 DIAGNOSIS — R51 Headache: Secondary | ICD-10-CM

## 2014-03-27 DIAGNOSIS — D696 Thrombocytopenia, unspecified: Secondary | ICD-10-CM | POA: Diagnosis present

## 2014-03-27 DIAGNOSIS — R209 Unspecified disturbances of skin sensation: Secondary | ICD-10-CM

## 2014-03-27 DIAGNOSIS — IMO0001 Reserved for inherently not codable concepts without codable children: Secondary | ICD-10-CM

## 2014-03-27 DIAGNOSIS — R5381 Other malaise: Secondary | ICD-10-CM | POA: Diagnosis present

## 2014-03-27 DIAGNOSIS — R079 Chest pain, unspecified: Secondary | ICD-10-CM | POA: Diagnosis present

## 2014-03-27 DIAGNOSIS — R509 Fever, unspecified: Secondary | ICD-10-CM

## 2014-03-27 DIAGNOSIS — D649 Anemia, unspecified: Secondary | ICD-10-CM | POA: Diagnosis present

## 2014-03-27 DIAGNOSIS — R000 Tachycardia, unspecified: Secondary | ICD-10-CM

## 2014-03-27 DIAGNOSIS — D72819 Decreased white blood cell count, unspecified: Secondary | ICD-10-CM

## 2014-03-27 DIAGNOSIS — K648 Other hemorrhoids: Secondary | ICD-10-CM

## 2014-03-27 DIAGNOSIS — E876 Hypokalemia: Secondary | ICD-10-CM | POA: Diagnosis present

## 2014-03-27 HISTORY — DX: Headache: R51

## 2014-03-27 HISTORY — DX: Gastro-esophageal reflux disease without esophagitis: K21.9

## 2014-03-27 HISTORY — DX: Headache, unspecified: R51.9

## 2014-03-27 LAB — POCT URINALYSIS DIP (DEVICE)
Bilirubin Urine: NEGATIVE
Glucose, UA: NEGATIVE mg/dL
Hgb urine dipstick: NEGATIVE
Leukocytes, UA: NEGATIVE
NITRITE: NEGATIVE
PROTEIN: NEGATIVE mg/dL
SPECIFIC GRAVITY, URINE: 1.01 (ref 1.005–1.030)
Urobilinogen, UA: 0.2 mg/dL (ref 0.0–1.0)
pH: 6 (ref 5.0–8.0)

## 2014-03-27 LAB — CBC
HEMATOCRIT: 38 % (ref 36.0–46.0)
Hemoglobin: 12.1 g/dL (ref 12.0–15.0)
MCH: 25.3 pg — ABNORMAL LOW (ref 26.0–34.0)
MCHC: 31.8 g/dL (ref 30.0–36.0)
MCV: 79.5 fL (ref 78.0–100.0)
Platelets: 97 10*3/uL — ABNORMAL LOW (ref 150–400)
RBC: 4.78 MIL/uL (ref 3.87–5.11)
RDW: 13.3 % (ref 11.5–15.5)
WBC: 3.7 10*3/uL — ABNORMAL LOW (ref 4.0–10.5)

## 2014-03-27 LAB — CREATININE, SERUM
Creatinine, Ser: 0.7 mg/dL (ref 0.50–1.10)
GFR calc Af Amer: >90 mL/min (ref >90)

## 2014-03-27 LAB — COMPREHENSIVE METABOLIC PANEL
ALT: 14 U/L (ref 0–35)
AST: 19 U/L (ref 0–37)
Albumin: 3.1 g/dL — ABNORMAL LOW (ref 3.5–5.2)
Alkaline Phosphatase: 54 U/L (ref 39–117)
BUN: 9 mg/dL (ref 6–23)
CALCIUM: 8.2 mg/dL — AB (ref 8.4–10.5)
CO2: 19 meq/L (ref 19–32)
Chloride: 103 mEq/L (ref 96–112)
Creatinine, Ser: 0.76 mg/dL (ref 0.50–1.10)
GFR calc non Af Amer: >90 mL/min (ref >90)
Glucose, Bld: 100 mg/dL — ABNORMAL HIGH (ref 70–99)
Potassium: 3.1 mEq/L — ABNORMAL LOW (ref 3.7–5.3)
Sodium: 138 mEq/L (ref 137–147)
TOTAL PROTEIN: 6.7 g/dL (ref 6.0–8.3)
Total Bilirubin: <0.2 mg/dL — ABNORMAL LOW (ref 0.3–1.2)

## 2014-03-27 LAB — CBC WITH DIFFERENTIAL/PLATELET
Basophils Absolute: 0 10*3/uL (ref 0.0–0.1)
Basophils Relative: 0 % (ref 0–1)
EOS ABS: 0 10*3/uL (ref 0.0–0.7)
EOS PCT: 0 % (ref 0–5)
HEMATOCRIT: 35 % — AB (ref 36.0–46.0)
Hemoglobin: 11.5 g/dL — ABNORMAL LOW (ref 12.0–15.0)
LYMPHS ABS: 1.2 10*3/uL (ref 0.7–4.0)
Lymphocytes Relative: 24 % (ref 12–46)
MCH: 25.6 pg — AB (ref 26.0–34.0)
MCHC: 32.9 g/dL (ref 30.0–36.0)
MCV: 78 fL (ref 78.0–100.0)
MONO ABS: 0.4 10*3/uL (ref 0.1–1.0)
Monocytes Relative: 9 % (ref 3–12)
Neutro Abs: 3.4 10*3/uL (ref 1.7–7.7)
Neutrophils Relative %: 68 % (ref 43–77)
Platelets: 92 10*3/uL — ABNORMAL LOW (ref 150–400)
RBC: 4.49 MIL/uL (ref 3.87–5.11)
RDW: 13.1 % (ref 11.5–15.5)
WBC: 5.1 10*3/uL (ref 4.0–10.5)

## 2014-03-27 LAB — MONONUCLEOSIS SCREEN: Mono Screen: NEGATIVE

## 2014-03-27 LAB — I-STAT TROPONIN, ED: Troponin i, poc: 0 ng/mL (ref 0.00–0.08)

## 2014-03-27 LAB — TSH: TSH: 0.437 u[IU]/mL (ref 0.350–4.500)

## 2014-03-27 LAB — TROPONIN I: Troponin I: <0.3 ng/mL (ref <0.30)

## 2014-03-27 LAB — RHEUMATOID FACTOR: Rhuematoid fact SerPl-aCnc: <10 IU/mL (ref <=14)

## 2014-03-27 LAB — POCT PREGNANCY, URINE: Preg Test, Ur: NEGATIVE

## 2014-03-27 LAB — POCT RAPID STREP A: STREPTOCOCCUS, GROUP A SCREEN (DIRECT): NEGATIVE

## 2014-03-27 LAB — SEDIMENTATION RATE: SED RATE: 22 mm/h (ref 0–22)

## 2014-03-27 LAB — C-REACTIVE PROTEIN: CRP: 3.3 mg/dL — ABNORMAL HIGH (ref <0.60)

## 2014-03-27 MED ORDER — ASPIRIN 81 MG PO CHEW
324.0000 mg | CHEWABLE_TABLET | Freq: Once | ORAL | Status: AC
  Administered 2014-03-27: 324 mg via ORAL

## 2014-03-27 MED ORDER — OXYCODONE HCL 5 MG PO TABS
5.0000 mg | ORAL_TABLET | ORAL | Status: DC | PRN
  Administered 2014-03-27 – 2014-03-29 (×3): 5 mg via ORAL
  Filled 2014-03-27 (×3): qty 1

## 2014-03-27 MED ORDER — SODIUM CHLORIDE 0.9 % IV SOLN: INTRAVENOUS | Status: AC

## 2014-03-27 MED ORDER — KETOROLAC TROMETHAMINE 30 MG/ML IJ SOLN
30.0000 mg | Freq: Once | INTRAMUSCULAR | Status: AC
  Administered 2014-03-27: 30 mg via INTRAVENOUS
  Filled 2014-03-27: qty 1

## 2014-03-27 MED ORDER — ONDANSETRON HCL 4 MG/2ML IJ SOLN
4.0000 mg | Freq: Four times a day (QID) | INTRAMUSCULAR | Status: DC | PRN
Start: 2014-03-27 — End: 2014-03-29

## 2014-03-27 MED ORDER — ASPIRIN 81 MG PO CHEW
CHEWABLE_TABLET | ORAL | Status: AC
  Filled 2014-03-27: qty 4

## 2014-03-27 MED ORDER — ACETAMINOPHEN 650 MG RE SUPP: 650.0000 mg | Freq: Four times a day (QID) | RECTAL | Status: DC | PRN

## 2014-03-27 MED ORDER — ENOXAPARIN SODIUM 40 MG/0.4ML ~~LOC~~ SOLN
40.0000 mg | SUBCUTANEOUS | Status: DC
  Administered 2014-03-27 – 2014-03-28 (×2): 40 mg via SUBCUTANEOUS
  Filled 2014-03-27 (×3): qty 0.4

## 2014-03-27 MED ORDER — ACETAMINOPHEN 325 MG PO TABS
ORAL_TABLET | ORAL | Status: AC
  Filled 2014-03-27: qty 2

## 2014-03-27 MED ORDER — SODIUM CHLORIDE 0.9 % IV BOLUS (SEPSIS)
1000.0000 mL | Freq: Once | INTRAVENOUS | Status: AC
  Administered 2014-03-27: 1000 mL via INTRAVENOUS

## 2014-03-27 MED ORDER — ONDANSETRON HCL 4 MG PO TABS
4.0000 mg | ORAL_TABLET | Freq: Four times a day (QID) | ORAL | Status: DC | PRN
  Administered 2014-03-28: 4 mg via ORAL
  Filled 2014-03-27: qty 1

## 2014-03-27 MED ORDER — ALUM & MAG HYDROXIDE-SIMETH 200-200-20 MG/5ML PO SUSP: 30.0000 mL | Freq: Four times a day (QID) | ORAL | Status: DC | PRN

## 2014-03-27 MED ORDER — METOCLOPRAMIDE HCL 5 MG/ML IJ SOLN
10.0000 mg | Freq: Once | INTRAMUSCULAR | Status: AC
  Administered 2014-03-27: 10 mg via INTRAVENOUS
  Filled 2014-03-27: qty 2

## 2014-03-27 MED ORDER — SODIUM CHLORIDE 0.9 % IJ SOLN
3.0000 mL | Freq: Two times a day (BID) | INTRAMUSCULAR | Status: DC
  Administered 2014-03-27 – 2014-03-29 (×4): 3 mL via INTRAVENOUS

## 2014-03-27 MED ORDER — SODIUM CHLORIDE 0.9 % IV SOLN
INTRAVENOUS | Status: DC
  Administered 2014-03-27 – 2014-03-29 (×5): via INTRAVENOUS

## 2014-03-27 MED ORDER — POTASSIUM CHLORIDE CRYS ER 20 MEQ PO TBCR
40.0000 meq | EXTENDED_RELEASE_TABLET | Freq: Once | ORAL | Status: AC
  Administered 2014-03-27: 40 meq via ORAL
  Filled 2014-03-27: qty 2

## 2014-03-27 MED ORDER — POTASSIUM CHLORIDE CRYS ER 20 MEQ PO TBCR: 40.0000 meq | EXTENDED_RELEASE_TABLET | Freq: Once | ORAL | Status: DC

## 2014-03-27 MED ORDER — PANTOPRAZOLE SODIUM 40 MG PO TBEC
40.0000 mg | DELAYED_RELEASE_TABLET | ORAL | Status: DC
  Administered 2014-03-27 – 2014-03-29 (×3): 40 mg via ORAL
  Filled 2014-03-27 (×2): qty 1

## 2014-03-27 MED ORDER — ACETAMINOPHEN 325 MG PO TABS
650.0000 mg | ORAL_TABLET | Freq: Four times a day (QID) | ORAL | Status: DC | PRN
  Administered 2014-03-27 – 2014-03-28 (×2): 650 mg via ORAL
  Filled 2014-03-27: qty 2

## 2014-03-27 MED ORDER — ACETAMINOPHEN 325 MG PO TABS
650.0000 mg | ORAL_TABLET | Freq: Once | ORAL | Status: AC
  Administered 2014-03-27: 650 mg via ORAL

## 2014-03-27 MED ORDER — DIPHENHYDRAMINE HCL 50 MG/ML IJ SOLN
25.0000 mg | Freq: Once | INTRAMUSCULAR | Status: AC
  Administered 2014-03-27: 25 mg via INTRAVENOUS
  Filled 2014-03-27: qty 1

## 2014-03-27 MED ORDER — SODIUM CHLORIDE 0.9 % IV SOLN
Freq: Once | INTRAVENOUS | Status: AC
  Administered 2014-03-27: 12:00:00 via INTRAVENOUS

## 2014-03-27 NOTE — ED Notes (Addendum)
Per EMS[facility] Patient complains of generalized weakness, fatigue, and fever. Patient states she started to have chest pain starting this morning in the left chest. Patient reports a history of fever. [facility] fever [facility] 102. Vitals per EMS: 115/98, 116, 98% on RA, 20. Patient denies any medical HX. Patient given 324 Aspirin.

## 2014-03-27 NOTE — ED Notes (Signed)
Cardiology at bedside.

## 2014-03-27 NOTE — ED Notes (Signed)
When asked again about pain prior to admission, pt denies chest pain but states she has had a headache for the majority of the afternoon- states she has chronic headache. States she thought it would get better after eating, but headache has continued. Medication will be given prior to admission.

## 2014-03-27 NOTE — ED Notes (Signed)
Patient helped up to the bathroom. Patient has a steady gait. Patient Alert and Oriented.

## 2014-03-27 NOTE — ED Notes (Signed)
Notified [facility] 

## 2014-03-27 NOTE — ED Notes (Signed)
Patient helped up to the bathroom. Patient has a steady gait. Complains of no dizziness or lightheadedness.

## 2014-03-27 NOTE — ED Notes (Signed)
Resident at the bedside

## 2014-03-27 NOTE — ED Notes (Signed)
C/o chest pain that is intermittent, dull, shortness of breath, intermittent sore throat and lymph node swelling, fatigue, body aches, and fever.  Onset yesterday

## 2014-03-27 NOTE — ED Notes (Signed)
Phlebotomy at the bedside  

## 2014-03-27 NOTE — ED Notes (Signed)
[facility] does not have a truck 

## 2014-03-27 NOTE — H&P (Signed)
[facility] History and Physical  Dajah Noda ZOX:[id number] DOB: 1985/12/01 DOA: 03/27/2014  Referring physician:  PCP: Marga Melnick, MD   Chief Complaint: Chest pain/fever  HPI: Tonya [name] is a 28 y.o. female with no significant past medical history presenting to the emergency department with complaints of chest pain and fever. She states having a history of intermittent chest pain which she attributes to gastroesophageal reflux disease. 6 bands episode of chest pain yesterday located parasternal region characterized as dull, achy, and nonradiating. This was not associated with nausea, vomiting or shortness of breath neither was it associated with physical exertion. She also developed fever, chills, malaise, myalgias, generalized weakness, fatigue. She presented to the urgent care Center today with the above-mentioned complaints. She was found to have a temperature of 102.2. She reports having sick contacts from her daughter who has URI symptoms.  Initial lab work in the emergency room showed a troponin within normal limits, white count of 5100. EKG revealed ST segment depression as cardiology was consulted. Cardiology recommending admission to the medicine service, follow troponins overnight. We'll check a transthoracic echocardiogram.                                                                                                              Review of Systems:  Constitutional:  No weight loss, night sweats, positive for Fevers, chills, fatigue.  HEENT:  No headaches, Difficulty swallowing,Tooth/dental problems, positive for Sore throat,  No sneezing, itching, ear ache, positive for nasal congestion, post nasal drip,  Cardio-vascular:  Positive for chest pain, denies Orthopnea, PND, swelling in lower extremities, anasarca, dizziness, palpitations  GI:  No heartburn, indigestion, abdominal pain, nausea, vomiting, diarrhea, change in bowel habits, loss of appetite  Resp:  No shortness  of breath with exertion or at rest. No excess mucus, no productive cough, No non-productive cough, No coughing up of blood.No change in color of mucus.No wheezing.No chest wall deformity  Skin:  no rash or lesions.  GU:  no dysuria, change in color of urine, no urgency or frequency. No flank pain.  Musculoskeletal:  No joint pain or swelling. No decreased range of motion. No back pain.  Psych:  No change in mood or affect. No depression or anxiety. No memory loss.   Past Medical History  Diagnosis Date  . Migraines    Past Surgical History  Procedure Laterality Date  . No past surgeries     Social History:  reports that she has never smoked. She has never used smokeless tobacco. She reports that she does not drink alcohol or use illicit drugs.  No Known Allergies  Family History  Problem Relation Age of Onset  . Hypertension Father   . Hyperlipidemia Father   . Gout Father   . Colon cancer Mother     Dx at 72  . Migraines Mother   . Seizures Brother   . Irritable bowel syndrome Maternal Grandmother   . Irritable bowel syndrome Mother   . Other      Patient does not know  if her family has had heart trouble due to most of family being in [place] with less access to healthcare     Prior to Admission medications   Medication Sig Start Date End Date Taking? Authorizing Provider  acetaminophen (TYLENOL) 500 MG tablet Take 1,000 mg by mouth every 8 (eight) hours as needed for moderate pain or fever.   Yes Historical Provider, MD  cetirizine (ZYRTEC) 10 MG tablet Take 10 mg by mouth daily.   Yes Historical Provider, MD   Physical Exam: Filed Vitals:   03/27/14 1617  BP: 107/72  Pulse: 89  Temp:   Resp: 18    BP 107/72  Pulse 89  Temp(Src) 99.2 F (37.3 C) (Oral)  Resp 18  SpO2 97%  LMP 03/04/2014  General:  Appears calm and comfortable, nontoxic appearing, following commands, pleasant and cooperative Eyes: PERRL, normal lids, irises & conjunctiva ENT: grossly  normal hearing, lips & tongue Neck: no LAD, masses or thyromegaly Cardiovascular: RRR, no m/r/g. No LE edema. Telemetry: SR, no arrhythmias  Respiratory: CTA bilaterally, no w/r/r. Normal respiratory effort. Abdomen: soft, ntnd Skin: no rash or induration seen on limited exam Musculoskeletal: grossly normal tone BUE/BLE Psychiatric: grossly normal mood and affect, speech fluent and appropriate Neurologic: grossly non-focal.          Labs on Admission:  Basic Metabolic Panel:  Recent Labs Lab 03/27/14 1329  NA 138  K 3.1*  CL 103  CO2 19  GLUCOSE 100*  BUN 9  CREATININE 0.76  CALCIUM 8.2*   Liver Function Tests:  Recent Labs Lab 03/27/14 1329  AST 19  ALT 14  ALKPHOS 54  BILITOT <0.2*  PROT 6.7  ALBUMIN 3.1*   No results found for this basename: LIPASE, AMYLASE,  in the last 168 hours No results found for this basename: AMMONIA,  in the last 168 hours CBC:  Recent Labs Lab 03/27/14 1329  WBC 5.1  NEUTROABS 3.4  HGB 11.5*  HCT 35.0*  MCV 78.0  PLT 92*   Cardiac Enzymes: No results found for this basename: CKTOTAL, CKMB, CKMBINDEX, TROPONINI,  in the last 168 hours  BNP (last 3 results) No results found for this basename: PROBNP,  in the last 8760 hours CBG: No results found for this basename: GLUCAP,  in the last 168 hours  Radiological Exams on Admission: Dg Chest 2 View  03/27/2014   CLINICAL DATA:  Chest pain.  EXAM: CHEST  2 VIEW  COMPARISON:  June 03, 2010.  FINDINGS: The heart size and mediastinal contours are within normal limits. Both lungs are clear. No pneumothorax or pleural effusion is noted. The visualized skeletal structures are unremarkable.  IMPRESSION: No acute cardiopulmonary abnormality seen.   Electronically Signed   By: Roque LiasJames  Green M.D.   On: 03/27/2014 12:14    EKG: Independently reviewed. ST depression in anterior lateral leads  Assessment/Plan Principal Problem:   Chest pain Active Problems:   Febrile illness    Thrombocytopenia   Anemia   Hypokalemia   1. Chest pain. Patient presenting with chest pain having atypical features, unclear etiology. Doubt this is ischemic in nature. Possibilities include pericarditis given patient's febrile illness. Cardiology consulted. Will cycle troponin's overnight, obtain a transthoracic echocardiogram in a.m. She is chest pain-free during my encounter 2. Febrile illness. Unclear etiology though I suspect it's a viral origin. Patient is nontoxic appearing, have a white count of 5100. She does have a platelet count of 92,000. She endorses sick contacts from her daughter. Possibilities  include infectious mononucleosis given history of fever and thrombocytopenia. Will followup on blood cultures.  3. Thrombocytopenia. Initial lab showing platelet count of 92,000. Could be secondary to underlying viral infection 4. DVT prophylaxis. Subcutaneous heparin   Code Status: Full code Family Communication: I spoke with family members at bedside Disposition Plan: Will place in 24 hour obs, do not anticipate her requiring greater than 2 night hospitalization  Time spent: 55 min  Jeralyn BennettZAMORA, EZEQUIEL [facility] Pager [phone]  **Disclaimer: This note may have been dictated with voice recognition software. Similar sounding words can inadvertently be transcribed and this note may contain transcription errors which may not have been corrected upon publication of note.**

## 2014-03-27 NOTE — ED Notes (Signed)
Cardiology at the bedside.

## 2014-03-27 NOTE — ED Provider Notes (Signed)
Medical screening examination/treatment/procedure(s) were performed by resident physician or non-physician practitioner and as supervising physician I was immediately available for consultation/collaboration.   KINDL,JAMES DOUGLAS MD.   James D Kindl, MD 03/27/14 1516 

## 2014-03-27 NOTE — ED Notes (Signed)
Tatyana, PA at the bedside  

## 2014-03-27 NOTE — Consult Note (Signed)
Cardiology Consultation Note  Patient ID: Tonya [name], MRN: [id number], DOB/AGE: 29-18-1987 29 y.o. 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 She was transferred to [facility] ED for further workup in part due to abnormal EKG. She moved to the [place] from [place] around age 28. She last visited 5 years ago. She denies any other formal PMH but states for the past year, she has been sick every 1-2 months that starts with sore throat then goes into fever/mylagias. She will often go to urgent care for this and receives antibiotics but says they don't usually help. She is concerned that it keeps returning. She has had generally poor appetite for quite some time. Yesterday she began having fatigue, myalgias, and nausea along with intermittent vague mild chest pain. It would come and go, lasting a few minutes at a time, predominantly on the right side of her chest. She has "a lot of aches and pains so didn't think much of it." She wondered if it was acid reflux. Today, however, she began feeling worse with increased dull chest pressure and body aches so presented to Urgent Care. Her chest pain lasted between 1-2 hours. Tmax was 102.2, pulse 116, POx normal, urine pregnancy normal, rapid strep negative. Pain is worse when sitting up and improved with lying down. No change with exertion, palpation or inspiration. No SOB, vomiting, LEE, orthopnea, cough, weight changes. No sick contacts or family members with similar symptoms. EKG shows inferior ST depression/TWI as well as in leads V3-V6 - no prior to compare to. Labs significant for hypokalemia 3.1, mild anemia 11.5, TSH wnl and  thrombocytopenia that was also present last spring.  Past Medical History  Diagnosis Date  . Migraines       Most Recent Cardiac Studies: None   Surgical History:  Past Surgical History  Procedure Laterality Date  . No past surgeries       Home Meds: Prior to Admission medications   Medication Sig Start Date End Date Taking? Authorizing Provider  acetaminophen (TYLENOL) 500 MG tablet Take 1,000 mg by mouth every 8 (eight) hours as needed for moderate pain or fever.   Yes Historical Provider, MD  cetirizine (ZYRTEC) 10 MG tablet Take 10 mg by mouth daily.   Yes Historical Provider, MD    Inpatient Medications:     Allergies: No Known Allergies  History   Social History  . Marital Status: Married    Spouse Name: N/A    Number of Children: 1  . Years of Education: N/A   Occupational History  . manicurist    Social History Main Topics  . Smoking status: Never Smoker   . Smokeless tobacco: Never Used  . Alcohol Use: No  . Drug Use: No  . Sexual Activity: Not Currently    Birth Control/ Protection: None   Other Topics Concern  . Not on file   Social History Narrative   She moved to the [place] from [place] around age 58.     Family History  Problem Relation Age of Onset  . Hypertension Father   . Hyperlipidemia Father   . Gout Father   . Colon cancer Mother  Dx at 58  . Migraines Mother   . Seizures Brother   . Irritable bowel syndrome Maternal Grandmother   . Irritable bowel syndrome Mother      Review of Systems: General: see above  Cardiovascular: negative for edema, orthopnea, palpitations, paroxysmal nocturnal dyspnea, shortness of breath or dyspnea on exertion Dermatological: negative for rash Respiratory: negative for cough or wheezing Urologic: negative for hematuria Abdominal: negative for vomiting, diarrhea, bright red blood per rectum, melena, or hematemesis Neurologic: negative for visual changes, syncope All other systems reviewed and  are otherwise negative except as noted above.  Labs: POC troponin neg x1 Lab Results  Component Value Date   WBC 5.1 03/27/2014   HGB 11.5* 03/27/2014   HCT 35.0* 03/27/2014   MCV 78.0 03/27/2014   PLT PENDING 03/27/2014     Recent Labs Lab 03/27/14 1329  NA 138  K 3.1*  CL 103  CO2 19  BUN 9  CREATININE 0.76  CALCIUM 8.2*  PROT 6.7  BILITOT <0.2*  ALKPHOS 54  ALT 14  AST 19  GLUCOSE 100*    Radiology/Studies:  Dg Chest 2 View6/23/2015   CLINICAL DATA:  Chest pain.  EXAM: CHEST  2 VIEW  COMPARISON:  June 03, 2010.  FINDINGS: The heart size and mediastinal contours are within normal limits. Both lungs are clear. No pneumothorax or pleural effusion is noted. The visualized skeletal structures are unremarkable.  IMPRESSION: No acute cardiopulmonary abnormality seen.   Electronically Signed   By: Sabino Dick M.D.   On: 03/27/2014 12:14    EKG: NSR inferior ST depression/TWI as well as in leads V2-V6. No prior available.  Physical Exam: Blood pressure 100/67, pulse 93, temperature 99.2 F (37.3 C), Tmax 102.2 at Urgent Care, temperature source Oral, resp. rate 19, last menstrual period 03/04/2014, SpO2 99.00%, unknown if currently breastfeeding. General: Well developed, well nourished asian F in no acute distress. Head: Normocephalic, atraumatic, sclera non-icteric, no xanthomas, nares are without discharge. No roth spots (Dr. Percival Spanish performed ophtho exam).  Neck: Negative for carotid bruits. JVD not elevated. Lungs: Dullness at R base. No wheezes, rales, or rhonchi. Breathing is unlabored. Heart: RRR borderline tachycardic with S1 S2. No murmurs, rubs, or gallops appreciated. Abdomen: Soft, non-tender, non-distended with normoactive bowel sounds. No hepatomegaly or splenomegaly. No rebound/guarding. No obvious abdominal masses. Msk:  Strength and tone appear normal for age. Extremities: No clubbing or cyanosis. No edema.  Distal pedal pulses are 2+ and equal bilaterally. No  Janeway lesions, osler notes. Neuro: Alert and oriented X 3. No facial asymmetry. No focal deficit. Moves all extremities spontaneously. Psych:  Responds to questions appropriately with a normal affect.   Assessment and Plan:   1. Febrile illness 2. Chest pain with abnormal EKG of unclear significance 3. Hypokalemia 4. Normocytic anemia 5. Thrombocytopenia, noted back to 2014  Given recent recurrent febrile illnesses, recommend internal medicine admission for further evaluation - ddx is wide including viral/bacterial/rikettsial illness (+/- related to [place] although she has not traveled there since 5 years ago), rheumatologic disease, malignancy, or subacute bacterial endocarditis. Not clear where thrombocytopenia fits into all of this or why EKG is abnormal  (?pericarditis). Ischemia is less likely given age, nonsmoker, no real risk factors. She is currently chest pain free. We have ordered cardiac markers, 2D echocardiogram, blood cultures, ESR, rheumatoid factor, then lipids in AM. Replete K with 59mq x 1. Will follow along.   Signed, DMelina CopaPA-C 03/27/2014, 2:45 PM   History and  all data above reviewed.  Patient examined.  I agree with the findings as above.  The patient has an interesting history of intermittent fevers.  She has had some mild chest discomfort that she discussed with the ED.  She did have nonspecific EKG changes as above.  However, the predominant complaint was one of feeling febrile with joint pains every couple of months for the last year or so.   The patient exam reveals COR:RRR, no rub  ,  Lungs: Clear  ,  Abd: Positive bowel sounds, no rebound no guarding, Ext No edema  .  All available labs, radiology testing, previous records reviewed. Agree with documented assessment and plan. Chest pain:  Atypical.  No suggestion of pericarditis.  However, we will start with an echocardiogram.  Doubt SBE as she has no physical stigmata of this.  We will draw some  routine labs including RF and look at the echocardiogram for evaluation of this.  However, again, there is a low pretest probability.    Jeneen Rinks Hochrein  4:07 PM  03/27/2014

## 2014-03-27 NOTE — ED Provider Notes (Signed)
CSN: [id number]     Arrival date & time 03/27/14  1259 History   First MD Initiated Contact with Patient 03/27/14 1301     Chief Complaint  Patient presents with  . Fatigue  . Fever  . Chest Pain     (Consider location/radiation/quality/duration/timing/severity/associated sxs/prior Treatment) HPI Tonya [name] is a 28 y.o. female who presents to emergency department with complaint of fever, chest pain, fatigue.  Patient states she has had similar symptoms every month. Symptoms lasting a few days at a time. States she has been to a different urgent care each time, receives antibiotics and feels better. States each time they test her for strep and mono and it is always negative. States this time, it is different because she did not have sore throat, and did have some shortness of breath. She had a fever of 102 for which she received aspirin and tylenol at urgent care. Symptoms began 3 days ago.  She denies any recent travel. No medical problems. No new medications. No rash. States she has been feeling very tired over last few months and requesting TSH checked. Pt does have an infant daughter, states she just got over a cold.   Past Medical History  Diagnosis Date  . Migraines    Past Surgical History  Procedure Laterality Date  . No past surgeries     Family History  Problem Relation Age of Onset  . Hypertension Father   . Hyperlipidemia Father   . Gout Father   . Colon cancer Mother   . Migraines Mother   . Seizures Brother   . Irritable bowel syndrome Maternal Grandmother   . Irritable bowel syndrome Mother    History  Substance Use Topics  . Smoking status: Never Smoker   . Smokeless tobacco: Never Used  . Alcohol Use: No   OB History   Grav Para Term Preterm Abortions TAB SAB Ect Mult Living   2 2 2       2      Review of Systems  Constitutional: Negative for fever and chills.  Respiratory: Positive for chest tightness and shortness of breath. Negative for cough.    Cardiovascular: Positive for chest pain. Negative for palpitations and leg swelling.  Gastrointestinal: Negative for nausea, vomiting, abdominal pain and diarrhea.  Genitourinary: Negative for dysuria, flank pain, vaginal bleeding, vaginal discharge, vaginal pain and pelvic pain.  Musculoskeletal: Negative for arthralgias, myalgias, neck pain and neck stiffness.  Skin: Negative for rash.  Neurological: Negative for dizziness, weakness, light-headedness and headaches.  All other systems reviewed and are negative.     Allergies  Review of patient's allergies indicates no known allergies.  Home Medications   Prior to Admission medications   Medication Sig Start Date End Date Taking? Authorizing Norell Brisbin  oxyCODONE-acetaminophen (PERCOCET/ROXICET) 5-325 MG per tablet Take 1-2 tablets by mouth every 4 (four) hours as needed. 01/05/13   Philip AspenSidney Callahan, DO  Prenatal Vit-Fe Fumarate-FA (PRENATAL MULTIVITAMIN) TABS Take 1 tablet by mouth daily at 12 noon.    Historical Aquila Delaughter, MD   BP 106/69  Pulse 99  Temp(Src) 99.2 F (37.3 C) (Oral)  Resp 12  SpO2 98%  LMP 03/04/2014 Physical Exam  Nursing note and vitals reviewed. Constitutional: She appears well-developed and well-nourished. No distress.  HENT:  Head: Normocephalic.  Eyes: Conjunctivae and EOM are normal. Pupils are equal, round, and reactive to light.  Neck: Neck supple.  Cardiovascular: Normal rate, regular rhythm and normal heart sounds.   Pulmonary/Chest: Effort normal and  breath sounds normal. No respiratory distress. She has no wheezes. She has no rales. She exhibits tenderness.  Mild left sided chest wall tenderness  Abdominal: Soft. Bowel sounds are normal. She exhibits no distension. There is no tenderness. There is no rebound.  Musculoskeletal: She exhibits no edema.  Neurological: She is alert.  Skin: Skin is warm and dry.  Psychiatric: She has a normal mood and affect. Her behavior is normal.    ED Course   Procedures (including critical care time) Labs Review Labs Reviewed  CBC WITH DIFFERENTIAL - Abnormal; Notable for the following:    Hemoglobin 11.5 (*)    HCT 35.0 (*)    MCH 25.6 (*)    Platelets 92 (*)    All other components within normal limits  COMPREHENSIVE METABOLIC PANEL - Abnormal; Notable for the following:    Potassium 3.1 (*)    Glucose, Bld 100 (*)    Calcium 8.2 (*)    Albumin 3.1 (*)    Total Bilirubin <0.2 (*)    All other components within normal limits  CULTURE, BLOOD (ROUTINE X 2)  CULTURE, BLOOD (ROUTINE X 2)  CULTURE, GROUP A STREP  TSH  TROPONIN I  TROPONIN I  TROPONIN I  RHEUMATOID FACTOR  SEDIMENTATION RATE  I-STAT TROPOININ, ED    Imaging Review Dg Chest 2 View  03/27/2014   CLINICAL DATA:  Chest pain.  EXAM: CHEST  2 VIEW  COMPARISON:  June 03, 2010.  FINDINGS: The heart size and mediastinal contours are within normal limits. Both lungs are clear. No pneumothorax or pleural effusion is noted. The visualized skeletal structures are unremarkable.  IMPRESSION: No acute cardiopulmonary abnormality seen.   Electronically Signed   By: Roque LiasJames  Green M.D.   On: 03/27/2014 12:14     EKG Interpretation   Date/Time:  Tuesday March 27 2014 13:04:14 EDT Ventricular Rate:  97 PR Interval:  157 QRS Duration: 97 QT Interval:  317 QTC Calculation: 403 R Axis:   67 Text Interpretation:  Sinus rhythm Nonspecific T abnormalities, diffuse  leads Abnormal ECG Confirmed by BEATON  MD, ROBERT ([id number]) on 03/27/2014  1:36:57 PM      MDM   Final diagnoses:  Chest pain, unspecified chest pain type  Febrile illness    Patient with left-sided chest pain, temperature 102 at an urgent care earlier. Her pain is now resolved with Tylenol and aspirin. She denies any symptoms at present. She only complaining of generalized fatigue which she has had for some time now. Will get labs, IV fluids, EKG.   EKG showing diffuse ST and T wave abnormalities. Change from EKG  that she has had in 2006. Discussed with cardiology they will come down and see patient. Concerning for possible pericarditis or endocarditis.   Patient was seen by cardiology, they have added blood work and asked for medicine to admit patient. I spoke with [facility] will admit her.  Filed Vitals:   03/27/14 1330 03/27/14 1400 03/27/14 1430 03/27/14 1530  BP: 100/67 100/68 106/74 114/77  Pulse: 93 97 95 94  Temp:      TempSrc:      Resp: 19 18 22 15   SpO2: 99% 98% 99% 97%       Lottie Musselatyana A Kirichenko, PA-C 03/27/14 1556

## 2014-03-27 NOTE — ED Provider Notes (Signed)
CSN: [id number]     Arrival date & time 03/27/14  1036 History   First MD Initiated Contact with Patient 03/27/14 1119     Chief Complaint  Patient presents with  . Chest Pain   (Consider location/radiation/quality/duration/timing/severity/associated sxs/prior Treatment) HPI Comments: 28 year old female presents complaining of fever, chest pain, sore throat, shortness of breath, lymph node swelling, body aches, fatigue. This all started yesterday. She reports a history of having an illness that is identical to this every month for the past year. She does not have a primary care doctor, she says that she just goes to random urgent cares each time. No leg swelling or recent travel. No history of heart problems, no history of DVT or PE.  Patient is a 28 y.o. female presenting with chest pain.  Chest Pain Associated symptoms: abdominal pain, back pain, cough, fatigue, fever and shortness of breath     Past Medical History  Diagnosis Date  . Migraines    Past Surgical History  Procedure Laterality Date  . No past surgeries     Family History  Problem Relation Age of Onset  . Hypertension Father   . Hyperlipidemia Father   . Gout Father   . Colon cancer Mother   . Migraines Mother   . Seizures Brother   . Irritable bowel syndrome Maternal Grandmother   . Irritable bowel syndrome Mother    History  Substance Use Topics  . Smoking status: Never Smoker   . Smokeless tobacco: Never Used  . Alcohol Use: No   OB History   Grav Para Term Preterm Abortions TAB SAB Ect Mult Living   2 2 2       2      Review of Systems  Constitutional: Positive for fever, chills and fatigue.  HENT: Positive for sore throat. Negative for congestion, ear pain and sinus pressure.   Respiratory: Positive for cough, chest tightness and shortness of breath.   Cardiovascular: Positive for chest pain. Negative for leg swelling.  Gastrointestinal: Positive for abdominal pain.  Musculoskeletal: Positive for  back pain.  All other systems reviewed and are negative.   Allergies  Review of patient's allergies indicates no known allergies.  Home Medications   Prior to Admission medications   Medication Sig Start Date End Date Taking? Authorizing Provider  oxyCODONE-acetaminophen (PERCOCET/ROXICET) 5-325 MG per tablet Take 1-2 tablets by mouth every 4 (four) hours as needed. 01/05/13   Philip AspenSidney Callahan, DO  Prenatal Vit-Fe Fumarate-FA (PRENATAL MULTIVITAMIN) TABS Take 1 tablet by mouth daily at 12 noon.    Historical Provider, MD   BP 115/98  Pulse 116  Temp(Src) 102.2 F (39 C) (Oral)  Resp 20  SpO2 100%  LMP 03/04/2014 Physical Exam  Nursing note and vitals reviewed. Constitutional: She is oriented to person, place, and time. Vital signs are normal. She appears well-developed and well-nourished. No distress.  HENT:  Head: Normocephalic and atraumatic.  Right Ear: External ear normal.  Left Ear: External ear normal.  Nose: Nose normal.  Mouth/Throat: Oropharynx is clear and moist. No oropharyngeal exudate.  Neck: Normal range of motion. Neck supple.  Cardiovascular: Regular rhythm, normal heart sounds and normal pulses.  Tachycardia present.  Exam reveals no gallop and no friction rub.   No murmur heard. Pulmonary/Chest: Effort normal and breath sounds normal. No respiratory distress. She has no wheezes. She has no rales.  Neurological: She is alert and oriented to person, place, and time. She has normal strength. Coordination normal.  Skin: Skin  is warm and dry. No rash noted. She is not diaphoretic.  Psychiatric: She has a normal mood and affect. Judgment normal.    ED Course  ED EKG  Date/Time: 03/27/2014 12:26 PM Performed by: Autumn MessingBAKER, ZACHARY, H Authorized by: Autumn MessingBAKER, ZACHARY, H Comparison: not compared with previous ECG  Previous ECG: no previous ECG available Rhythm: sinus tachycardia Rate: tachycardic Conduction: conduction normal ST Segments: ST segments normal T  depression: II, III, aVF, V3, V4 and V2 T flattening: V5 and V6 Clinical impression: abnormal ECG   (including critical care time) Labs Review Labs Reviewed  POCT URINALYSIS DIP (DEVICE) - Abnormal; Notable for the following:    Ketones, ur TRACE (*)    All other components within normal limits  POCT RAPID STREP A ([facility] ONLY)  POCT PREGNANCY, URINE    Imaging Review Dg Chest 2 View  03/27/2014   CLINICAL DATA:  Chest pain.  EXAM: CHEST  2 VIEW  COMPARISON:  June 03, 2010.  FINDINGS: The heart size and mediastinal contours are within normal limits. Both lungs are clear. No pneumothorax or pleural effusion is noted. The visualized skeletal structures are unremarkable.  IMPRESSION: No acute cardiopulmonary abnormality seen.   Electronically Signed   By: Roque LiasJames  Green M.D.   On: 03/27/2014 12:14     MDM   1. Fever and chills   2. Other chest pain   3. Tachycardia    Patient with some febrile illness, also with ischemic EKG. Cardiac monitor, oxygen, IV started, given aspirin, no nitroglycerin because the pain has stopped right now. Transferred to the ED via [facility]   Meds ordered this encounter  Medications  . acetaminophen (TYLENOL) tablet 650 mg    Sig:   . 0.9 %  sodium chloride infusion    Sig:   . aspirin chewable tablet 324 mg    Sig:        Graylon GoodZachary H Baker, PA-C 03/27/14 1229

## 2014-03-28 DIAGNOSIS — D696 Thrombocytopenia, unspecified: Secondary | ICD-10-CM

## 2014-03-28 DIAGNOSIS — IMO0001 Reserved for inherently not codable concepts without codable children: Secondary | ICD-10-CM

## 2014-03-28 DIAGNOSIS — D72819 Decreased white blood cell count, unspecified: Secondary | ICD-10-CM

## 2014-03-28 DIAGNOSIS — R509 Fever, unspecified: Secondary | ICD-10-CM

## 2014-03-28 LAB — BASIC METABOLIC PANEL
BUN: 11 mg/dL (ref 6–23)
CALCIUM: 8 mg/dL — AB (ref 8.4–10.5)
CO2: 24 meq/L (ref 19–32)
Chloride: 110 mEq/L (ref 96–112)
Creatinine, Ser: 0.69 mg/dL (ref 0.50–1.10)
GFR calc Af Amer: >90 mL/min (ref >90)
GFR calc non Af Amer: >90 mL/min (ref >90)
GLUCOSE: 90 mg/dL (ref 70–99)
POTASSIUM: 4.4 meq/L (ref 3.7–5.3)
Sodium: 142 mEq/L (ref 137–147)

## 2014-03-28 LAB — CBC
HCT: 35.3 % — ABNORMAL LOW (ref 36.0–46.0)
HEMOGLOBIN: 11.2 g/dL — AB (ref 12.0–15.0)
MCH: 25.7 pg — AB (ref 26.0–34.0)
MCHC: 31.7 g/dL (ref 30.0–36.0)
MCV: 81 fL (ref 78.0–100.0)
Platelets: 82 10*3/uL — ABNORMAL LOW (ref 150–400)
RBC: 4.36 MIL/uL (ref 3.87–5.11)
RDW: 13.6 % (ref 11.5–15.5)
WBC: 2.4 10*3/uL — ABNORMAL LOW (ref 4.0–10.5)

## 2014-03-28 LAB — HEPATITIS PANEL, ACUTE
HCV Ab: NEGATIVE
Hep A IgM: NONREACTIVE
Hep B C IgM: NONREACTIVE
Hepatitis B Surface Ag: NEGATIVE

## 2014-03-28 LAB — LIPID PANEL
CHOL/HDL RATIO: 2.9 ratio
Cholesterol: 115 mg/dL (ref 0–200)
HDL: 40 mg/dL (ref >39)
LDL Cholesterol: 64 mg/dL (ref 0–99)
Triglycerides: 56 mg/dL (ref <150)
VLDL: 11 mg/dL (ref 0–40)

## 2014-03-28 LAB — HIV ANTIBODY (ROUTINE TESTING W REFLEX): HIV 1&2 Ab, 4th Generation: NONREACTIVE

## 2014-03-28 LAB — TROPONIN I: Troponin I: <0.3 ng/mL (ref <0.30)

## 2014-03-28 MED ORDER — ZOLPIDEM TARTRATE 5 MG PO TABS
5.0000 mg | ORAL_TABLET | Freq: Once | ORAL | Status: AC
  Administered 2014-03-28: 5 mg via ORAL
  Filled 2014-03-28: qty 1

## 2014-03-28 NOTE — Progress Notes (Signed)
UR Completed Brenda Graves-Bigelow, RN,BSN [phone]  

## 2014-03-28 NOTE — Progress Notes (Signed)
[facility] PROGRESS NOTE  Tonya [name] XNT:[id number] DOB: [birth year] DOA: 03/27/2014 PCP: [name], MD  Assessment/Plan: Principal Problem:   Chest pain -Cardiac enzymes negative, await 2-D echo  -appreciate cardiology input. Active Problems:   Febrile illness -Unclear etiology-HIV nonreactive, acute hepatitis panel negative, rheumatoid factor within normal limits, ESR unremarkable and mono screen negative -I Have consulted infectious disease for their recommendations>> Dr. [name] to see  Thrombocytopenia/Anemia -Possibly secondary to cause of febrile illness   Hypokalemia -Resolved   Code Status: Full Family Communication: None at bedside Disposition Plan: To home when medically ready   Consultants:  Cardiology  Procedures:  Echocardiogram-pending  Antibiotics:  None   HPI/Subjective: Beginning to feel feverish again, denies dysuria no cough. She denies chest pain and no shortness of breath  Objective: Filed Vitals:   03/28/14 1159  BP:   Pulse:   Temp: 98.6 F (37 C)  Resp:     Intake/Output Summary (Last 24 hours) at 03/28/14 1236 Last data filed at 03/28/14 0900  Gross per 24 hour  Intake 1608.33 ml  Output      0 ml  Net 1608.33 ml   Filed Weights   03/27/14 1651 03/28/14 0538  Weight: 60.464 kg (133 lb 4.8 oz) 61.774 kg (136 lb 3 oz)    Exam:  General: alert & oriented x 3 In NAD Cardiovascular: RRR, nl S1 s2 Respiratory: CTAB Abdomen: soft +BS NT/ND, no masses palpable Extremities: No cyanosis and no edema    Data Reviewed: Basic Metabolic Panel:  Recent Labs Lab 03/27/14 1329 03/27/14 1820 03/28/14 0545  NA 138  --  142  K 3.1*  --  4.4  CL 103  --  110  CO2 19  --  24  GLUCOSE 100*  --  90  BUN 9  --  11  CREATININE 0.76 0.70 0.69  CALCIUM 8.2*  --  8.0*   Liver Function Tests:  Recent Labs Lab 03/27/14 1329  AST 19  ALT 14  ALKPHOS 54  BILITOT <0.2*  PROT 6.7  ALBUMIN 3.1*   No results found for  this basename: LIPASE, AMYLASE,  in the last 168 hours No results found for this basename: AMMONIA,  in the last 168 hours CBC:  Recent Labs Lab 03/27/14 1329 03/27/14 1820 03/28/14 0545  WBC 5.1 3.7* 2.4*  NEUTROABS 3.4  --   --   HGB 11.5* 12.1 11.2*  HCT 35.0* 38.0 35.3*  MCV 78.0 79.5 81.0  PLT 92* 97* 82*   Cardiac Enzymes:  Recent Labs Lab 03/27/14 1554 03/27/14 1820 03/27/14 2316 03/28/14 0545  TROPONINI <0.30 <0.30 <0.30 <0.30   BNP (last 3 results) No results found for this basename: PROBNP,  in the last 8760 hours CBG: No results found for this basename: GLUCAP,  in the last 168 hours  Recent Results (from the past 240 hour(s))  CULTURE, BLOOD (ROUTINE X 2)     Status: None   Collection Time    03/27/14  3:45 PM      Result Value Ref Range Status   Specimen Description BLOOD RIGHT ARM   Final   Special Requests BOTTLES DRAWN AEROBIC AND ANAEROBIC 10 CC   Final   Culture  Setup Time     Final   Value: 03/27/2014 20:44     Performed at [facility]   Culture     Final   Value:        BLOOD CULTURE RECEIVED NO GROWTH TO DATE CULTURE  WILL BE HELD FOR 5 DAYS BEFORE ISSUING A FINAL NEGATIVE REPORT     Performed at [facility]   Report Status PENDING   Incomplete  CULTURE, BLOOD (ROUTINE X 2)     Status: None   Collection Time    03/27/14  3:54 PM      Result Value Ref Range Status   Specimen Description BLOOD LEFT ARM   Final   Special Requests BOTTLES DRAWN AEROBIC AND ANAEROBIC [facility]   Final   Culture  Setup Time     Final   Value: 03/27/2014 20:43     Performed at [facility]   Culture     Final   Value:        BLOOD CULTURE RECEIVED NO GROWTH TO DATE CULTURE WILL BE HELD FOR 5 DAYS BEFORE ISSUING A FINAL NEGATIVE REPORT     Performed at [facility]   Report Status PENDING   Incomplete     Studies: Dg Chest 2 View  03/27/2014   CLINICAL DATA:  Chest pain.  EXAM: CHEST  2 VIEW  COMPARISON:  June 03, 2010.   FINDINGS: The heart size and mediastinal contours are within normal limits. Both lungs are clear. No pneumothorax or pleural effusion is noted. The visualized skeletal structures are unremarkable.  IMPRESSION: No acute cardiopulmonary abnormality seen.   Electronically Signed   By: Sabino Dick M.D.   On: 03/27/2014 12:14    Scheduled Meds: . sodium chloride   Intravenous STAT  . enoxaparin (LOVENOX) injection  40 mg Subcutaneous Q24H  . pantoprazole  40 mg Oral Q24H  . sodium chloride  3 mL Intravenous Q12H   Continuous Infusions: . sodium chloride 100 mL/hr at 03/28/14 1427    Principal Problem:   Chest pain Active Problems:   Febrile illness   Thrombocytopenia   Anemia   Hypokalemia    Time spent: [facility] Pager [phone]. If 7PM-7AM, please contact night-coverage at [email or web address], password [facility] 03/28/2014, 12:36 PM  LOS: 1 day

## 2014-03-28 NOTE — Consult Note (Addendum)
[facility]  Total days of antibiotics 0               Reason for Consult: fuo    Referring Physician: [email or web address]  Principal Problem:   Chest pain Active Problems:   Febrile illness   Thrombocytopenia   Anemia   Hypokalemia    HPI: Tonya [name] is a 28 y.o. female who reports that she has had 1 year worth of intermittent fevers. They peak in the morning and evenings up to 102F usually lasting anywhere from 2-5 days that recurs roughly every month. She previously would go to urgent care centers to be evaluated for her symptoms and often would be dispensed with antibiotics, amoxicillin, doxycycline, azithromycin, without much improvement. She states that in addition to feeling poorly having chills, fevers, malaise, can have exacerbation of her baseline arthralgias. She was admitted in the setting of having fevers, worsening of her baseline chest pain. Found to have leukopenia. She reports one of her children having a cough, but no fevers. No one in the family has similar illness. As a child, she reports having frequent illness that her mother would treat. No recent tick bites, no recent travel.   She works as a Scientist, forensic in a [employer]  Past Medical History  Diagnosis Date  . GERD (gastroesophageal reflux disease)   . Migraines     "few times/year" (03/27/2014)  . Headache     "~ weekly" (03/27/2014)    Allergies: No Known Allergies   MEDICATIONS: . sodium chloride   Intravenous STAT  . enoxaparin (LOVENOX) injection  40 mg Subcutaneous Q24H  . pantoprazole  40 mg Oral Q24H  . sodium chloride  3 mL Intravenous Q12H    History  Substance Use Topics  . Smoking status: Never Smoker   . Smokeless tobacco: Never Used  . Alcohol Use: Not on file    Family History  Problem Relation Age of Onset  . Hypertension Father   . Hyperlipidemia Father   . Gout Father   . Colon cancer Mother     Dx at 36  . Migraines Mother   . Seizures Brother   .  Irritable bowel syndrome Maternal Grandmother   . Irritable bowel syndrome Mother   . Other      Patient does not know if her family has had heart trouble due to most of family being in [place] with less access to healthcare    Review of Systems  Constitutional: positive for fever, chills, diaphoresis, activity change, appetite change, fatigue and unexpected weight change.  HENT: Negative for congestion, sore throat, rhinorrhea, sneezing, trouble swallowing and sinus pressure.  Eyes: Negative for photophobia and visual disturbance.  Respiratory: Negative for cough, chest tightness, shortness of breath, wheezing and stridor.  Cardiovascular: Negative for chest pain, palpitations and leg swelling.  Gastrointestinal: Negative for nausea, vomiting, abdominal pain, diarrhea, constipation, blood in stool, abdominal distention and anal bleeding.  Genitourinary: Negative for dysuria, hematuria, flank pain and difficulty urinating.  Musculoskeletal: Negative for myalgias, back pain, joint swelling, arthralgias and gait problem.  Skin: Negative for color change, pallor, rash and wound.  Neurological: Negative for dizziness, tremors, weakness and light-headedness.  Hematological: Negative for adenopathy. Does not bruise/bleed easily.  Psychiatric/Behavioral: Negative for behavioral problems, confusion, sleep disturbance, dysphoric mood, decreased concentration and agitation.     OBJECTIVE: Temp:  [97.3 F (36.3 C)-99.5 F (37.5 C)] 99.5 F (37.5 C) (06/24 1344) Pulse Rate:  [62-95] 95 (06/24  1344) Resp:  [18] 18 (06/24 1344) BP: (90-103)/(50-66) 103/66 mmHg (06/24 1344) SpO2:  [97 %-100 %] 99 % (06/24 1344) Weight:  [136 lb 3 oz (61.774 kg)] 136 lb 3 oz (61.774 kg) (06/24 0538)  Physical Exam  Constitutional:  oriented to person, place, and time. appears well-developed and well-nourished. No distress.  HENT:  Mouth/Throat: Oropharynx is clear and moist. No oropharyngeal exudate.    Cardiovascular: Normal rate, regular rhythm and normal heart sounds. Exam reveals no gallop and no friction rub.  No murmur heard.  Pulmonary/Chest: Effort normal and breath sounds normal. No respiratory distress.  has no wheezes.  Abdominal: Soft. Bowel sounds are normal.  exhibits no distension. There is no tenderness.  Lymphadenopathy: no cervical adenopathy.  Neurological: alert and oriented to person, place, and time.  Skin: Skin is warm and dry. No rash noted. No erythema.  Psychiatric: a normal mood and affect.  behavior is normal.    LABS: Results for orders placed during the hospital encounter of 03/27/14 (from the past 48 hour(s))  CBC WITH DIFFERENTIAL     Status: Abnormal   Collection Time    03/27/14  1:29 PM      Result Value Ref Range   WBC 5.1  4.0 - 10.5 K/uL   RBC 4.49  3.87 - 5.11 MIL/uL   Hemoglobin 11.5 (*) 12.0 - 15.0 g/dL   HCT 35.0 (*) 36.0 - 46.0 %   MCV 78.0  78.0 - 100.0 fL   MCH 25.6 (*) 26.0 - 34.0 pg   MCHC 32.9  30.0 - 36.0 g/dL   RDW 13.1  11.5 - 15.5 %   Platelets 92 (*) 150 - 400 K/uL   Comment: PLATELET COUNT CONFIRMED BY SMEAR   Neutrophils Relative % 68  43 - 77 %   Neutro Abs 3.4  1.7 - 7.7 K/uL   Lymphocytes Relative 24  12 - 46 %   Lymphs Abs 1.2  0.7 - 4.0 K/uL   Monocytes Relative 9  3 - 12 %   Monocytes Absolute 0.4  0.1 - 1.0 K/uL   Eosinophils Relative 0  0 - 5 %   Eosinophils Absolute 0.0  0.0 - 0.7 K/uL   Basophils Relative 0  0 - 1 %   Basophils Absolute 0.0  0.0 - 0.1 K/uL  COMPREHENSIVE METABOLIC PANEL     Status: Abnormal   Collection Time    03/27/14  1:29 PM      Result Value Ref Range   Sodium 138  137 - 147 mEq/L   Potassium 3.1 (*) 3.7 - 5.3 mEq/L   Chloride 103  96 - 112 mEq/L   CO2 19  19 - 32 mEq/L   Glucose, Bld 100 (*) 70 - 99 mg/dL   BUN 9  6 - 23 mg/dL   Creatinine, Ser 0.76  0.50 - 1.10 mg/dL   Calcium 8.2 (*) 8.4 - 10.5 mg/dL   Total Protein 6.7  6.0 - 8.3 g/dL   Albumin 3.1 (*) 3.5 - 5.2 g/dL   AST  19  0 - 37 U/L   ALT 14  0 - 35 U/L   Alkaline Phosphatase 54  39 - 117 U/L   Total Bilirubin <0.2 (*) 0.3 - 1.2 mg/dL   GFR calc non Af Amer >90  >90 mL/min   GFR calc Af Amer >90  >90 mL/min   Comment: (NOTE)     The eGFR has been calculated using the CKD EPI equation.  This calculation has not been validated in all clinical situations.     eGFR's persistently <90 mL/min signify possible Chronic Kidney     Disease.  Randolm Idol, ED     Status: None   Collection Time    03/27/14  1:33 PM      Result Value Ref Range   Troponin i, poc 0.00  0.00 - 0.08 ng/mL   Comment 3            Comment: Due to the release kinetics of cTnI,     a negative result within the first hours     of the onset of symptoms does not rule out     myocardial infarction with certainty.     If myocardial infarction is still suspected,     repeat the test at appropriate intervals.  TSH     Status: None   Collection Time    03/27/14  2:23 PM      Result Value Ref Range   TSH 0.437  0.350 - 4.500 uIU/mL  RHEUMATOID FACTOR     Status: None   Collection Time    03/27/14  2:23 PM      Result Value Ref Range   Rheumatoid Factor <10  <=14 IU/mL   Comment: (NOTE)                             Interpretive Table                        Low Positive: 15 - 41 IU/mL                        High Positive:  >= 42 IU/mL     In addition to the RF result, and clinical symptoms including joint     involvement, the 2010 ACR Classification Criteria for     scoring/diagnosing Rheumatoid Arthritis include the results of the     following tests:  CRP (78295), ESR (15010), and CCP (APCA) (62130).     www.rheumatology.org/practice/clinical/classification/ra/ra_2010.asp     Performed at [facility], BLOOD (ROUTINE X 2)     Status: None   Collection Time    03/27/14  3:45 PM      Result Value Ref Range   Specimen Description BLOOD RIGHT ARM     Special Requests BOTTLES DRAWN AEROBIC AND ANAEROBIC 10 CC      Culture  Setup Time       Value: 03/27/2014 20:44     Performed at [facility]   Culture       Value:        BLOOD CULTURE RECEIVED NO GROWTH TO DATE CULTURE WILL BE HELD FOR 5 DAYS BEFORE ISSUING A FINAL NEGATIVE REPORT     Performed at [facility]   Report Status PENDING    TROPONIN I     Status: None   Collection Time    03/27/14  3:54 PM      Result Value Ref Range   Troponin I <0.30  <0.30 ng/mL   Comment:            Due to the release kinetics of cTnI,     a negative result within the first hours     of the onset of symptoms does not rule out     myocardial infarction with certainty.  If myocardial infarction is still suspected,     repeat the test at appropriate intervals.  SEDIMENTATION RATE     Status: None   Collection Time    03/27/14  3:54 PM      Result Value Ref Range   Sed Rate 22  0 - 22 mm/hr  CULTURE, BLOOD (ROUTINE X 2)     Status: None   Collection Time    03/27/14  3:54 PM      Result Value Ref Range   Specimen Description BLOOD LEFT ARM     Special Requests BOTTLES DRAWN AEROBIC AND ANAEROBIC 6CC     Culture  Setup Time       Value: 03/27/2014 20:43     Performed at [facility]   Culture       Value:        BLOOD CULTURE RECEIVED NO GROWTH TO DATE CULTURE WILL BE HELD FOR 5 DAYS BEFORE ISSUING A FINAL NEGATIVE REPORT     Performed at [facility]   Report Status PENDING    CBC     Status: Abnormal   Collection Time    03/27/14  6:20 PM      Result Value Ref Range   WBC 3.7 (*) 4.0 - 10.5 K/uL   RBC 4.78  3.87 - 5.11 MIL/uL   Hemoglobin 12.1  12.0 - 15.0 g/dL   HCT 38.0  36.0 - 46.0 %   MCV 79.5  78.0 - 100.0 fL   MCH 25.3 (*) 26.0 - 34.0 pg   MCHC 31.8  30.0 - 36.0 g/dL   RDW 13.3  11.5 - 15.5 %   Platelets 97 (*) 150 - 400 K/uL   Comment: CONSISTENT WITH PREVIOUS RESULT  CREATININE, SERUM     Status: None   Collection Time    03/27/14  6:20 PM      Result Value Ref Range   Creatinine, Ser 0.70  0.50  - 1.10 mg/dL   GFR calc non Af Amer >90  >90 mL/min   GFR calc Af Amer >90  >90 mL/min   Comment: (NOTE)     The eGFR has been calculated using the CKD EPI equation.     This calculation has not been validated in all clinical situations.     eGFR's persistently <90 mL/min signify possible Chronic Kidney     Disease.  TROPONIN I     Status: None   Collection Time    03/27/14  6:20 PM      Result Value Ref Range   Troponin I <0.30  <0.30 ng/mL   Comment:            Due to the release kinetics of cTnI,     a negative result within the first hours     of the onset of symptoms does not rule out     myocardial infarction with certainty.     If myocardial infarction is still suspected,     repeat the test at appropriate intervals.  MONONUCLEOSIS SCREEN     Status: None   Collection Time    03/27/14  6:20 PM      Result Value Ref Range   Mono Screen NEGATIVE  NEGATIVE  C-REACTIVE PROTEIN     Status: Abnormal   Collection Time    03/27/14  6:20 PM      Result Value Ref Range   CRP 3.3 (*) <0.60 mg/dL   Comment: Performed at [facility]  HIV ANTIBODY (ROUTINE TESTING)     Status: None   Collection Time    03/27/14  6:20 PM      Result Value Ref Range   HIV 1&2 Ab, 4th Generation NONREACTIVE  NONREACTIVE   Comment: (NOTE)     A NONREACTIVE HIV Ag/Ab result does not exclude HIV infection since     the time frame for seroconversion is variable. If acute HIV infection     is suspected, a HIV-1 RNA Qualitative TMA test is recommended.     HIV-1/2 Antibody Diff         Not indicated.     HIV-1 RNA, Qual TMA           Not indicated.     PLEASE NOTE: This information has been disclosed to you from records     whose confidentiality may be protected by state law. If your state     requires such protection, then the state law prohibits you from making     any further disclosure of the information without the specific written     consent of the person to whom it pertains, or as  otherwise permitted     by law. A general authorization for the release of medical or other     information is NOT sufficient for this purpose.     The performance of this assay has not been clinically validated in     patients less than 64 years old.     Performed at [facility], ACUTE     Status: None   Collection Time    03/27/14  6:20 PM      Result Value Ref Range   Hepatitis B Surface Ag NEGATIVE  NEGATIVE   HCV Ab NEGATIVE  NEGATIVE   Hep A IgM NON REACTIVE  NON REACTIVE   Hep B C IgM NON REACTIVE  NON REACTIVE   Comment: (NOTE)     High levels of Hepatitis B Core IgM antibody are detectable     during the acute stage of Hepatitis B. This antibody is used     to differentiate current from past HBV infection.     Performed at [facility]  TROPONIN I     Status: None   Collection Time    03/27/14 11:16 PM      Result Value Ref Range   Troponin I <0.30  <0.30 ng/mL   Comment:            Due to the release kinetics of cTnI,     a negative result within the first hours     of the onset of symptoms does not rule out     myocardial infarction with certainty.     If myocardial infarction is still suspected,     repeat the test at appropriate intervals.  CBC     Status: Abnormal   Collection Time    03/28/14  5:45 AM      Result Value Ref Range   WBC 2.4 (*) 4.0 - 10.5 K/uL   RBC 4.36  3.87 - 5.11 MIL/uL   Hemoglobin 11.2 (*) 12.0 - 15.0 g/dL   HCT 35.3 (*) 36.0 - 46.0 %   MCV 81.0  78.0 - 100.0 fL   MCH 25.7 (*) 26.0 - 34.0 pg   MCHC 31.7  30.0 - 36.0 g/dL   RDW 13.6  11.5 - 15.5 %   Platelets 82 (*) 150 -  400 K/uL   Comment: CONSISTENT WITH PREVIOUS RESULT  BASIC METABOLIC PANEL     Status: Abnormal   Collection Time    03/28/14  5:45 AM      Result Value Ref Range   Sodium 142  137 - 147 mEq/L   Potassium 4.4  3.7 - 5.3 mEq/L   Chloride 110  96 - 112 mEq/L   CO2 24  19 - 32 mEq/L   Glucose, Bld 90  70 - 99 mg/dL   BUN 11  6 - 23  mg/dL   Creatinine, Ser 0.69  0.50 - 1.10 mg/dL   Calcium 8.0 (*) 8.4 - 10.5 mg/dL   GFR calc non Af Amer >90  >90 mL/min   GFR calc Af Amer >90  >90 mL/min   Comment: (NOTE)     The eGFR has been calculated using the CKD EPI equation.     This calculation has not been validated in all clinical situations.     eGFR's persistently <90 mL/min signify possible Chronic Kidney     Disease.  TROPONIN I     Status: None   Collection Time    03/28/14  5:45 AM      Result Value Ref Range   Troponin I <0.30  <0.30 ng/mL   Comment:            Due to the release kinetics of cTnI,     a negative result within the first hours     of the onset of symptoms does not rule out     myocardial infarction with certainty.     If myocardial infarction is still suspected,     repeat the test at appropriate intervals.  LIPID PANEL     Status: None   Collection Time    03/28/14  5:45 AM      Result Value Ref Range   Cholesterol 115  0 - 200 mg/dL   Triglycerides 56  <150 mg/dL   HDL 40  >39 mg/dL   Total CHOL/HDL Ratio 2.9     VLDL 11  0 - 40 mg/dL   LDL Cholesterol 64  0 - 99 mg/dL   Comment:            Total Cholesterol/HDL:CHD Risk     Coronary Heart Disease Risk Table                         Men   Women      1/2 Average Risk   3.4   3.3      Average Risk       5.0   4.4      2 X Average Risk   9.6   7.1      3 X Average Risk  23.4   11.0                Use the calculated Patient Ratio     above and the CHD Risk Table     to determine the patient's CHD Risk.                ATP III CLASSIFICATION (LDL):      <100     mg/dL   Optimal      100-129  mg/dL   Near or Above                        Optimal  130-159  mg/dL   Borderline      160-189  mg/dL   High      >190     mg/dL   Very High    MICRO: 6/23 blood cx pending  IMAGING: Dg Chest 2 View  03/27/2014   CLINICAL DATA:  Chest pain.  EXAM: CHEST  2 VIEW  COMPARISON:  June 03, 2010.  FINDINGS: The heart size and mediastinal  contours are within normal limits. Both lungs are clear. No pneumothorax or pleural effusion is noted. The visualized skeletal structures are unremarkable.  IMPRESSION: No acute cardiopulmonary abnormality seen.   Electronically Signed   By: Sabino Dick M.D.   On: 03/27/2014 12:14   Assessment/Plan:  28 yo. healthy female who was admitted for fever of 102 with chest pain and leukopenia/thrombocytopenia who also reports having intermittent fever for several months concerning for FUO.  - current presentation likely related to viral illness but would not explain her intermittent fevers prior to admit - will repeat blood culture, check quantiferon, ana, await results on pending labs - will check Immunoglobulins to see if she possibly has CVID - if WBC continues to worsen plus LFTS changes, may need to do empiric trial of doxycycline for RMSF or erhlichiosis   - as an outpatient, we will likely arrange patient to do 2 wk fever diary where she checks her temps 3x per day and assess frequency of her fevers.  Elzie Rings [facility] [phone]

## 2014-03-28 NOTE — Progress Notes (Signed)
    Subjective:  Denies CP or dyspnea   Objective:  Filed Vitals:   03/27/14 1617 03/27/14 1651 03/27/14 2047 03/28/14 0538  BP: 107/72 94/60 94/54  90/50  Pulse: 89 94 80 62  Temp:  98.1 F (36.7 C) 97.3 F (36.3 C) 97.3 F (36.3 C)  TempSrc:  Oral Oral Oral  Resp: 18 18 18 18   Height:  5\' 3"  (1.6 m)    Weight:  133 lb 4.8 oz (60.464 kg)  136 lb 3 oz (61.774 kg)  SpO2: 97% 100% 97% 100%    Intake/Output from previous day:  Intake/Output Summary (Last 24 hours) at 03/28/14 0800 Last data filed at 03/28/14 0600  Gross per 24 hour  Intake 1368.33 ml  Output      0 ml  Net 1368.33 ml    Physical Exam: Physical exam: Well-developed well-nourished in no acute distress.  Skin is warm and dry.  HEENT is normal.  Neck is supple.  Chest is clear to auscultation with normal expansion.  Cardiovascular exam is regular rate and rhythm.  Abdominal exam nontender or distended. No masses palpated. Extremities show no edema. neuro grossly intact    Lab Results: Basic Metabolic Panel:  Recent Labs  84/13/2406/23/15 1329 03/27/14 1820 03/28/14 0545  NA 138  --  142  K 3.1*  --  4.4  CL 103  --  110  CO2 19  --  24  GLUCOSE 100*  --  90  BUN 9  --  11  CREATININE 0.76 0.70 0.69  CALCIUM 8.2*  --  8.0*   CBC:  Recent Labs  03/27/14 1329 03/27/14 1820 03/28/14 0545  WBC 5.1 3.7* 2.4*  NEUTROABS 3.4  --   --   HGB 11.5* 12.1 11.2*  HCT 35.0* 38.0 35.3*  MCV 78.0 79.5 81.0  PLT 92* 97* 82*   Cardiac Enzymes:  Recent Labs  03/27/14 1820 03/27/14 2316 03/28/14 0545  TROPONINI <0.30 <0.30 <0.30     Assessment/Plan:  1 fever-etiology unclear. Blood cultures are pending. She has apparently had intermittent febrile illness for one year. Would ask infectious disease to evaluate. No peripheral stigmata of SBE. Await echocardiogram. Note normal sedimentation rate, rheumatoid factor and mononucleosis/hepatitis screens negative. HIV negative. 2 pancytopenia-patient's  white blood cell count has decreased and she is noted to have thrombocytopenia and mild anemia. Further evaluation per primary care. Question viral etiology. As above would consider infectious disease consult and may need hematology oncology consult as well. If echocardiogram unremarkable and blood cultures negative no further cardiac workup planned. Olga MillersBrian Crenshaw 03/28/2014, 8:00 AM

## 2014-03-29 DIAGNOSIS — I369 Nonrheumatic tricuspid valve disorder, unspecified: Secondary | ICD-10-CM

## 2014-03-29 DIAGNOSIS — R51 Headache: Secondary | ICD-10-CM

## 2014-03-29 LAB — CBC
HCT: 33.9 % — ABNORMAL LOW (ref 36.0–46.0)
Hemoglobin: 10.8 g/dL — ABNORMAL LOW (ref 12.0–15.0)
MCH: 25.1 pg — ABNORMAL LOW (ref 26.0–34.0)
MCHC: 31.9 g/dL (ref 30.0–36.0)
MCV: 78.7 fL (ref 78.0–100.0)
PLATELETS: 77 10*3/uL — AB (ref 150–400)
RBC: 4.31 MIL/uL (ref 3.87–5.11)
RDW: 13.5 % (ref 11.5–15.5)
WBC: 4.3 10*3/uL (ref 4.0–10.5)

## 2014-03-29 LAB — DIFFERENTIAL
Basophils Absolute: 0 10*3/uL (ref 0.0–0.1)
Basophils Relative: 0 % (ref 0–1)
Eosinophils Absolute: 0 10*3/uL (ref 0.0–0.7)
Eosinophils Relative: 1 % (ref 0–5)
LYMPHS ABS: 1.8 10*3/uL (ref 0.7–4.0)
LYMPHS PCT: 39 % (ref 12–46)
MONO ABS: 0.8 10*3/uL (ref 0.1–1.0)
MONOS PCT: 17 % — AB (ref 3–12)
NEUTROS ABS: 2 10*3/uL (ref 1.7–7.7)
Neutrophils Relative %: 43 % (ref 43–77)

## 2014-03-29 LAB — EPSTEIN-BARR VIRUS VCA ANTIBODY PANEL
EBV EA IgG: <5 U/mL (ref <9.0)
EBV NA IgG: 589 U/mL — ABNORMAL HIGH (ref <18.0)
EBV VCA IgG: 186 U/mL — ABNORMAL HIGH (ref <18.0)
EBV VCA IgM: 47.6 U/mL — ABNORMAL HIGH (ref <36.0)

## 2014-03-29 LAB — CULTURE, GROUP A STREP

## 2014-03-29 LAB — CMV IGM

## 2014-03-29 LAB — CMV ANTIBODY, IGG (EIA): CMV Ab - IgG: 2.4 U/mL — ABNORMAL HIGH (ref <0.60)

## 2014-03-29 MED ORDER — DIPHENHYDRAMINE HCL 50 MG/ML IJ SOLN
25.0000 mg | Freq: Once | INTRAMUSCULAR | Status: AC
  Administered 2014-03-29: 25 mg via INTRAVENOUS
  Filled 2014-03-29: qty 1

## 2014-03-29 MED ORDER — DOXYCYCLINE HYCLATE 100 MG IV SOLR
100.0000 mg | Freq: Two times a day (BID) | INTRAVENOUS | Status: DC
  Filled 2014-03-29 (×2): qty 100

## 2014-03-29 MED ORDER — METOCLOPRAMIDE HCL 5 MG/ML IJ SOLN
10.0000 mg | Freq: Once | INTRAMUSCULAR | Status: AC
  Administered 2014-03-29: 10 mg via INTRAVENOUS
  Filled 2014-03-29: qty 2

## 2014-03-29 MED ORDER — DOXYCYCLINE HYCLATE 100 MG PO CAPS: 100.0000 mg | ORAL_CAPSULE | Freq: Two times a day (BID) | ORAL | Status: DC

## 2014-03-29 MED ORDER — KETOROLAC TROMETHAMINE 30 MG/ML IJ SOLN
30.0000 mg | Freq: Once | INTRAMUSCULAR | Status: AC
  Administered 2014-03-29: 30 mg via INTRAVENOUS
  Filled 2014-03-29: qty 1

## 2014-03-29 NOTE — Progress Notes (Addendum)
[facility]    Date of Admission:  03/27/2014   Total days of antibiotics 0   ID: Marc Gersten is a 28 y.o. female with fuo, presented with leukopenia, thrombocytopenia nad fevers Principal Problem:   Chest pain Active Problems:   Febrile illness   Thrombocytopenia   Anemia   Hypokalemia    Subjective: Felt poorly yesterday, mostly due to migranous headache. She reports feeling better today since she has slept much of the day. Ha improved. No fevers today. Felt feverish yesterday but no documented temp >100. She is interested in going home  Medications:  . pantoprazole  40 mg Oral Q24H  . sodium chloride  3 mL Intravenous Q12H    Objective: Vital signs in last 24 hours: Temp:  [97.8 F (36.6 C)-99.8 F (37.7 C)] 97.8 F (36.6 C) (06/25 0525) Pulse Rate:  [57-93] 57 (06/25 0525) Resp:  [18] 18 (06/25 0525) BP: (108-124)/(61-68) 124/61 mmHg (06/25 0525) SpO2:  [90 %-100 %] 100 % (06/25 0525) Weight:  [133 lb 2.5 oz (60.4 kg)] 133 lb 2.5 oz (60.4 kg) (06/25 0525) Physical Exam  Constitutional:  oriented to person, place, and time. appears well-developed and well-nourished. No distress.  HENT:  Mouth/Throat: Oropharynx is clear and moist. No oropharyngeal exudate.  Cardiovascular: Normal rate, regular rhythm and normal heart sounds. Exam reveals no gallop and no friction rub.  No murmur heard.  Pulmonary/Chest: Effort normal and breath sounds normal. No respiratory distress.  has no wheezes.  Abdominal: Soft. Bowel sounds are normal.  exhibits no distension. There is no tenderness.  Lymphadenopathy: no cervical adenopathy.  Neurological: alert and oriented to person, place, and time.  Skin: Skin is warm and dry. No rash noted. No erythema.  Psychiatric: a normal mood and affect.behavior is normal.   Lab Results  Recent Labs  03/27/14 1329 03/27/14 1820 03/28/14 0545 03/29/14 0625  WBC 5.1 3.7* 2.4* 4.3  HGB 11.5* 12.1 11.2* 10.8*  HCT  35.0* 38.0 35.3* 33.9*  NA 138  --  142  --   K 3.1*  --  4.4  --   CL 103  --  110  --   CO2 19  --  24  --   BUN 9  --  11  --   CREATININE 0.76 0.70 0.69  --    Liver Panel  Recent Labs  03/27/14 1329  PROT 6.7  ALBUMIN 3.1*  AST 19  ALT 14  ALKPHOS 54  BILITOT <0.2*   Sedimentation Rate  Recent Labs  03/27/14 1554  ESRSEDRATE 22   C-Reactive Protein  Recent Labs  03/27/14 1820  CRP 3.3*    Microbiology: 6/23 blood cx ngtd 6/25 blood cx ngtd Studies/Results: No results found.   Assessment/Plan: Leukopenia = slightly improved today, will add differential.  Thrombocytopenia = somewhat worsened since admit from 98 to 77. Will discontinue enoxaparin.  Fever = unable to assess fever curve since she is getting antipyretics. Presumably febrile yesterday but tmax 99.8 and given tylenol. For purposes of determining her fever curve please only give tylenol when temp 101+. Work up thus far showing she has previous exposed to CMV, EBV (possibly reactivation, will check heterophile IgM). Clinical picture not completely consistent with RMSF or ehrlichiosis, no recent insect bites. Can hold off on starting empiric doxycycline. For discharge ask her to monitor temp 3 x per day x 1 wk. We will see her back in ID clinic   Will follow up on  labs as outpatient and have her be seen in ID clinic She would probably benefit from establishing care with PCP   [facility], [facility] Cell: [phone] Pager: [phone]  03/29/2014, 1:46 PM

## 2014-03-29 NOTE — Progress Notes (Signed)
Physician Discharge Summary  Tonya [name] QQV:[id number] DOB: 07-09-1986 DOA: 03/27/2014  PCP: [name], MD  Admit date: 03/27/2014 Discharge date: 03/29/2014  Time spent: <30 minutes  Recommendations for Outpatient Follow-up:  Follow-up Information   Follow up with [name], MD. (in 1-2weeks,call for appt upon discharge)    Specialty:  Internal Medicine   Contact information:   [address] [place] [address] [phone]       Follow up with [name], MD. (next week, call for appt upon discharge)    Specialty:  Infectious Diseases   Contact information:   [place] [address] [phone]        Discharge Diagnoses:  Principal Problem:   Chest pain Active Problems:   Febrile illness   Thrombocytopenia   Anemia   Hypokalemia   Discharge Condition: Improved/stable  Diet recommendation: Regular  Filed Weights   03/27/14 1651 03/28/14 0538 03/29/14 0525  Weight: 60.464 kg (133 lb 4.8 oz) 61.774 kg (136 lb 3 oz) 60.4 kg (133 lb 2.5 oz)    History of present illness:  Tonya [name] is a 28 y.o. female with no significant past medical history presenting to the emergency department with complaints of chest pain and fever. She states having a history of intermittent chest pain which she attributes to gastroesophageal reflux disease. 6 bands episode of chest pain yesterday located parasternal region characterized as dull, achy, and nonradiating. This was not associated with nausea, vomiting or shortness of breath neither was it associated with physical exertion. She also developed fever, chills, malaise, myalgias, generalized weakness, fatigue. She presented to the urgent care Center today with the above-mentioned complaints. She was found to have a temperature of 102.2. She reports having sick contacts from her daughter who has URI symptoms. Initial lab work in the emergency room showed a troponin within normal limits, white count  of 5100. EKG revealed ST segment depression as cardiology was consulted. Cardiology recommending admission to the medicine service, follow troponins overnight. We'll check a transthoracic echocardiogram.    Hospital Course:  Chest pain  -As discussed above upon admission Cardiac enzymes were cycled and came back negative, 2-D echo with EF of 55-60%, normal wall motion -She has remained chest pain-free and per cardiology no further workup recommended. Active Problems:  Febrile illness  -Unclear etiology-HIV nonreactive, acute hepatitis panel negative, rheumatoid factor within normal limits, ESR unremarkable and mono screen negative -2-D echo within normal limits  -EBV VCA IgM and IgG elevated, CMV IgG elevated and the IgM within normal limits -Patient has remained afebrile since initial temp on admission>> and she feels much better today -6/25 blood cultures in process, and also pending- protein electrophoresis, ANA, quantitative CMV, early care antibody panel, heterophile antibody reflect [name], [facility] spotted fever serologies>> ordered today per Dr. [name] and she recommends for patient to be DC'd after this labs were drawn, and to follow up outpatient in her office for results and further management as clinically appropriate -Dr. [name] recommends to give patient a prescription for doxycycline which she is to only start taking if she starts having fevers again. She is to keep a diary of her fevers and followup in the clinic next week as directed  Thrombocytopenia/Anemia  -Patient with no gross bleeding follow up  -Thrombocytopenia and leukopenia possibly secondary to underlying etiology of the fevers -Outpatient followup with ID for pending studies and further management as appropriate -If persisting on outpatient followup and all pending negative will  recommend a referral to heme outpatient.  Hypokalemia  -resolved, k replaced  Leukopenia -Resolved likely secondary to viral  Etiology  Procedures:  2-D echo Study Conclusions  - Left ventricle: The cavity size was normal. Systolic function was normal. The estimated ejection fraction was in the range of 55% to 60%. Wall motion was normal; there were no regional wall motion abnormalities. - Mitral valve: There was trivial regurgitation.  Consultations:  Cardiology  Infectious disease  Discharge Exam: Filed Vitals:   03/29/14 1355  BP: 93/61  Pulse: 81  Temp: 98 F (36.7 C)  Resp: 16  Exam:  General: alert & oriented x 3 In NAD  Cardiovascular: RRR, nl S1 s2  Respiratory: CTAB  Abdomen: soft +BS NT/ND, no masses palpable  Extremities: No cyanosis and no edema    Discharge Instructions You were cared for by a hospitalist during your hospital stay. If you have any questions about your discharge medications or the care you received while you were in the hospital after you are discharged, you can call the unit and asked to speak with the hospitalist on call if the hospitalist that took care of you is not available. Once you are discharged, your primary care physician will handle any further medical issues. Please note that NO REFILLS for any discharge medications will be authorized once you are discharged, as it is imperative that you return to your primary care physician (or establish a relationship with a primary care physician if you do not have one) for your aftercare needs so that they can reassess your need for medications and monitor your lab values.  Discharge Instructions   Diet general    Complete by:  As directed      Increase activity slowly    Complete by:  As directed             Medication List    STOP taking these medications       acetaminophen 500 MG tablet  Commonly known as:  TYLENOL      TAKE these medications       cetirizine 10 MG tablet  Commonly known as:  ZYRTEC  Take 10 mg by mouth daily.     doxycycline 100 MG capsule  Commonly known as:  VIBRAMYCIN  Take  1 capsule (100 mg total) by mouth 2 (two) times daily.       No Known Allergies    The results of significant diagnostics from this hospitalization (including imaging, microbiology, ancillary and laboratory) are listed below for reference.    Significant Diagnostic Studies: Dg Chest 2 View  03/27/2014   CLINICAL DATA:  Chest pain.  EXAM: CHEST  2 VIEW  COMPARISON:  June 03, 2010.  FINDINGS: The heart size and mediastinal contours are within normal limits. Both lungs are clear. No pneumothorax or pleural effusion is noted. The visualized skeletal structures are unremarkable.  IMPRESSION: No acute cardiopulmonary abnormality seen.   Electronically Signed   By: Sabino Dick M.D.   On: 03/27/2014 12:14    Microbiology: Recent Results (from the past 240 hour(s))  CULTURE, GROUP A STREP     Status: None   Collection Time    03/27/14 11:50 AM      Result Value Ref Range Status   Specimen Description THROAT   Final   Special Requests NONE   Final   Culture     Final   Value: No Beta Hemolytic Streptococci Isolated     Performed at [facility]   Report Status 03/29/2014 FINAL   Final  CULTURE, BLOOD (ROUTINE X 2)     Status: None   Collection Time    03/27/14  3:45 PM      Result Value Ref Range Status   Specimen Description BLOOD RIGHT ARM   Final   Special Requests BOTTLES DRAWN AEROBIC AND ANAEROBIC 10 CC   Final   Culture  Setup Time     Final   Value: 03/27/2014 20:44     Performed at [facility]   Culture     Final   Value:        BLOOD CULTURE RECEIVED NO GROWTH TO DATE CULTURE WILL BE HELD FOR 5 DAYS BEFORE ISSUING A FINAL NEGATIVE REPORT     Performed at [facility]   Report Status PENDING   Incomplete  CULTURE, BLOOD (ROUTINE X 2)     Status: None   Collection Time    03/27/14  3:54 PM      Result Value Ref Range Status   Specimen Description BLOOD LEFT ARM   Final   Special Requests BOTTLES DRAWN AEROBIC AND ANAEROBIC 6CC   Final   Culture   Setup Time     Final   Value: 03/27/2014 20:43     Performed at [facility]   Culture     Final   Value:        BLOOD CULTURE RECEIVED NO GROWTH TO DATE CULTURE WILL BE HELD FOR 5 DAYS BEFORE ISSUING A FINAL NEGATIVE REPORT     Performed at [facility]   Report Status PENDING   Incomplete     Labs: Basic Metabolic Panel:  Recent Labs Lab 03/27/14 1329 03/27/14 1820 03/28/14 0545  NA 138  --  142  K 3.1*  --  4.4  CL 103  --  110  CO2 19  --  24  GLUCOSE 100*  --  90  BUN 9  --  11  CREATININE 0.76 0.70 0.69  CALCIUM 8.2*  --  8.0*   Liver Function Tests:  Recent Labs Lab 03/27/14 1329  AST 19  ALT 14  ALKPHOS 54  BILITOT <0.2*  PROT 6.7  ALBUMIN 3.1*   No results found for this basename: LIPASE, AMYLASE,  in the last 168 hours No results found for this basename: AMMONIA,  in the last 168 hours CBC:  Recent Labs Lab 03/27/14 1329 03/27/14 1820 03/28/14 0545 03/29/14 0335 03/29/14 0625  WBC 5.1 3.7* 2.4*  --  4.3  NEUTROABS 3.4  --   --  2.0  --   HGB 11.5* 12.1 11.2*  --  10.8*  HCT 35.0* 38.0 35.3*  --  33.9*  MCV 78.0 79.5 81.0  --  78.7  PLT 92* 97* 82*  --  77*   Cardiac Enzymes:  Recent Labs Lab 03/27/14 1554 03/27/14 1820 03/27/14 2316 03/28/14 0545  TROPONINI <0.30 <0.30 <0.30 <0.30   BNP: BNP (last 3 results) No results found for this basename: PROBNP,  in the last 8760 hours CBG: No results found for this basename: GLUCAP,  in the last 168 hours     Signed:  Sheila Oats  [facility] 03/29/2014, 6:14 PM

## 2014-03-29 NOTE — Progress Notes (Signed)
Subjective: Has migraine   Objective: Vital signs in last 24 hours: Temp:  [97.8 F (36.6 C)-99.8 F (37.7 C)] 97.8 F (36.6 C) (06/25 0525) Pulse Rate:  [57-95] 57 (06/25 0525) Resp:  [18] 18 (06/25 0525) BP: (103-124)/(61-68) 124/61 mmHg (06/25 0525) SpO2:  [90 %-100 %] 100 % (06/25 0525) Weight:  [133 lb 2.5 oz (60.4 kg)] 133 lb 2.5 oz (60.4 kg) (06/25 0525) Last BM Date: 03/28/14  Intake/Output from previous day: 06/24 0701 - 06/25 0700 In: 1888.3 [P.O.:540; I.V.:1348.3] Out: 450 [Urine:450] Intake/Output this shift:    Medications Current Facility-Administered Medications  Medication Dose Route Frequency Provider Last Rate Last Dose  . 0.9 %  sodium chloride infusion   Intravenous Continuous Jeralyn BennettEzequiel Zamora, MD 100 mL/hr at 03/28/14 2311    . acetaminophen (TYLENOL) tablet 650 mg  650 mg Oral Q6H PRN Jeralyn BennettEzequiel Zamora, MD   650 mg at 03/28/14 1251   Or  . acetaminophen (TYLENOL) suppository 650 mg  650 mg Rectal Q6H PRN Jeralyn BennettEzequiel Zamora, MD      . alum & mag hydroxide-simeth (MAALOX/MYLANTA) 200-200-20 MG/5ML suspension 30 mL  30 mL Oral Q6H PRN Jeralyn BennettEzequiel Zamora, MD      . diphenhydrAMINE (BENADRYL) injection 25 mg  25 mg Intravenous Once Leanne ChangKatherine P Schorr, NP      . enoxaparin (LOVENOX) injection 40 mg  40 mg Subcutaneous Q24H Jeralyn BennettEzequiel Zamora, MD   40 mg at 03/28/14 1819  . ketorolac (TORADOL) 30 MG/ML injection 30 mg  30 mg Intravenous Once Leanne ChangKatherine P Schorr, NP      . metoCLOPramide (REGLAN) injection 10 mg  10 mg Intravenous Once Leanne ChangKatherine P Schorr, NP      . ondansetron (ZOFRAN) tablet 4 mg  4 mg Oral Q6H PRN Jeralyn BennettEzequiel Zamora, MD   4 mg at 03/28/14 1818   Or  . ondansetron (ZOFRAN) injection 4 mg  4 mg Intravenous Q6H PRN Jeralyn BennettEzequiel Zamora, MD      . oxyCODONE (Oxy IR/ROXICODONE) immediate release tablet 5 mg  5 mg Oral Q4H PRN Jeralyn BennettEzequiel Zamora, MD   5 mg at 03/29/14 0320  . pantoprazole (PROTONIX) EC tablet 40 mg  40 mg Oral Q24H Jeralyn BennettEzequiel Zamora, MD   40 mg at  03/28/14 1818  . sodium chloride 0.9 % injection 3 mL  3 mL Intravenous Q12H Jeralyn BennettEzequiel Zamora, MD   3 mL at 03/28/14 2124    PE: General appearance: alert, cooperative and no distress Lungs: clear to auscultation bilaterally Heart: regular rate and rhythm, S1, S2 normal, no murmur, click, rub or gallop Extremities: No LEE Pulses: 2+ and symmetric Skin: Warm and dry Neurologic: Grossly normal  Lab Results:   Recent Labs  03/27/14 1329 03/27/14 1820 03/28/14 0545  WBC 5.1 3.7* 2.4*  HGB 11.5* 12.1 11.2*  HCT 35.0* 38.0 35.3*  PLT 92* 97* 82*   BMET  Recent Labs  03/27/14 1329 03/27/14 1820 03/28/14 0545  NA 138  --  142  K 3.1*  --  4.4  CL 103  --  110  CO2 19  --  24  GLUCOSE 100*  --  90  BUN 9  --  11  CREATININE 0.76 0.70 0.69  CALCIUM 8.2*  --  8.0*   PT/INR No results found for this basename: LABPROT, INR,  in the last 72 hours Cholesterol  Recent Labs  03/28/14 0545  CHOL 115   Lipid Panel     Component Value Date/Time   CHOL 115 03/28/2014 0545  TRIG 56 03/28/2014 0545   HDL 40 03/28/2014 0545   CHOLHDL 2.9 03/28/2014 0545   VLDL 11 03/28/2014 0545   LDLCALC 64 03/28/2014 0545    Cardiac Panel (last 3 results)  Recent Labs  03/27/14 1820 03/27/14 2316 03/28/14 0545  TROPONINI <0.30 <0.30 <0.30    Assessment/Plan  Principal Problem:   Chest pain Active Problems:   Febrile illness   Thrombocytopenia   Anemia   Hypokalemia  1 fever- Blood cultures negative thus far.  She has apparently had intermittent febrile illness for one year.  Note normal sedimentation rate, rheumatoid factor and mononucleosis/hepatitis screens negative. HIV negative.   Echo today.  Infectious disease following.   EBV VCA elevated.   2 pancytopenia-patient's white blood cell count has decreased further and she is noted to have thrombocytopenia and mild anemia. Further evaluation per primary care.   3  Hypokalemia-resolved yesterday 4. CP: Resolved.  Ruled out  for MI.  Echo pending.  NSR on telemetry.   LOS: 2 days    HAGER, BRYAN PA-C 03/29/2014 7:22 AM  As above, patient seen and examined. No chest pain or dyspnea. Blood cultures are negative to date. She does have a pancytopenia. EBV IgM elevated. Question mononucleosis. Infectious disease is following. We will await echocardiogram as outlined previously. If normal no further cardiac workup. Followup blood cultures. Olga MillersBrian Crenshaw

## 2014-03-29 NOTE — Progress Notes (Signed)
Utilization review completed.  

## 2014-03-29 NOTE — Discharge Instructions (Signed)
Cardiac Diet This diet can help prevent heart disease and stroke. Many factors influence your heart health, including eating and exercise habits. Coronary risk rises a lot with abnormal blood fat (lipid) levels. Cardiac meal planning includes limiting unhealthy fats, increasing healthy fats, and making other small dietary changes. General guidelines are as follows:  Adjust calorie intake to reach and maintain desirable body weight.  Limit total fat intake to less than 30% of total calories. Saturated fat should be less than 7% of calories.  Saturated fats are found in animal products and in some vegetable products. Saturated vegetable fats are found in coconut oil, cocoa butter, palm oil, and palm kernel oil. Read labels carefully to avoid these products as much as possible. Use butter in moderation. Choose tub margarines and oils that have 2 grams of fat or less. Good cooking oils are canola and olive oils.  Practice low-fat cooking techniques. Do not fry food. Instead, broil, bake, boil, steam, grill, roast on a rack, stir-fry, or microwave it. Other fat reducing suggestions include:  Remove the skin from poultry.  Remove all visible fat from meats.  Skim the fat off stews, soups, and gravies before serving them.  Steam vegetables in water or broth instead of sauting them in fat.  Avoid foods with trans fat (or hydrogenated oils), such as commercially fried foods and commercially baked goods. Commercial shortening and deep-frying fats will contain trans fat.  Increase intake of fruits, vegetables, whole grains, and legumes to replace foods high in fat.  Increase consumption of nuts, legumes, and seeds to at least 4 servings weekly. One serving of a legume equals  cup, and 1 serving of nuts or seeds equals  cup.  Choose whole grains more often. Have 3 servings per day (a serving is 1 ounce [oz]).  Eat 4 to 5 servings of vegetables per day. A serving of vegetables is 1 cup of raw leafy  vegetables;  cup of raw or cooked cut-up vegetables;  cup of vegetable juice.  Eat 4 to 5 servings of fruit per day. A serving of fruit is 1 medium whole fruit;  cup of dried fruit;  cup of fresh, frozen, or canned fruit;  cup of 100% fruit juice.  Increase your intake of dietary fiber to 20 to 30 grams per day. Insoluble fiber may help lower your risk of heart disease and may help curb your appetite.  Soluble fiber binds cholesterol to be removed from the blood. Foods high in soluble fiber are dried beans, citrus fruits, oats, apples, bananas, broccoli, Brussels sprouts, and eggplant.  Try to include foods fortified with plant sterols or stanols, such as yogurt, breads, juices, or margarines. Choose several fortified foods to achieve a daily intake of 2 to 3 grams of plant sterols or stanols.  Foods with omega-3 fats can help reduce your risk of heart disease. Aim to have a 3.5 oz portion of fatty fish twice per week, such as salmon, mackerel, albacore tuna, sardines, lake trout, or herring. If you wish to take a fish oil supplement, choose one that contains 1 gram of both DHA and EPA.  Limit processed meats to 2 servings (3 oz portion) weekly.  Limit the sodium in your diet to 1500 milligrams (mg) per day. If you have high blood pressure, talk to a registered dietitian about a DASH (Dietary Approaches to Stop Hypertension) eating plan.  Limit sweets and beverages with added sugar, such as soda, to no more than 5 servings per week. One  serving is:   1 tablespoon sugar.  1 tablespoon jelly or jam.   cup sorbet.  1 cup lemonade.   cup regular soda. CHOOSING FOODS Starches  Allowed: Breads: All kinds (wheat, rye, raisin, white, oatmeal, [place], [place], and English muffin bread). Low-fat rolls: English muffins, frankfurter and hamburger buns, bagels, pita bread, tortillas (not fried). Pancakes, waffles, biscuits, and muffins made with recommended oil.  Avoid: Products made with  saturated or trans fats, oils, or whole milk products. Butter rolls, cheese breads, croissants. Commercial doughnuts, muffins, sweet rolls, biscuits, waffles, pancakes, store-bought mixes. Crackers  Allowed: Low-fat crackers and snacks: Animal, graham, rye, saltine (with recommended oil, no lard), oyster, and matzo crackers. Bread sticks, melba toast, rusks, flatbread, pretzels, and light popcorn.  Avoid: High-fat crackers: cheese crackers, butter crackers, and those made with coconut, palm oil, or trans fat (hydrogenated oils). Buttered popcorn. Cereals  Allowed: Hot or cold whole-grain cereals.  Avoid: Cereals containing coconut, hydrogenated vegetable fat, or animal fat. Potatoes / Pasta / Rice  Allowed: All kinds of potatoes, rice, and pasta (such as macaroni, spaghetti, and noodles).  Avoid: Pasta or rice prepared with cream sauce or high-fat cheese. Chow mein noodles, [place] fries. Vegetables  Allowed: All vegetables and vegetable juices.  Avoid: Fried vegetables. Vegetables in cream, butter, or high-fat cheese sauces. Limit coconut. Fruit in cream or custard. Protein  Allowed: Limit your intake of meat, seafood, and poultry to no more than 6 oz (cooked weight) per day. All lean, well-trimmed beef, veal, pork, and lamb. All chicken and [place] without skin. All fish and shellfish. Wild game: wild duck, rabbit, pheasant, and venison. Egg whites or low-cholesterol egg substitutes may be used as desired. Meatless dishes: recipes with dried beans, peas, lentils, and tofu (soybean curd). Seeds and nuts: all seeds and most nuts.  Avoid: Prime grade and other heavily marbled and fatty meats, such as short ribs, spare ribs, rib eye roast or steak, frankfurters, sausage, bacon, and high-fat luncheon meats, mutton. Caviar. Commercially fried fish. Domestic duck, goose, venison sausage. Organ meats: liver, gizzard, heart, chitterlings, brains, kidney, sweetbreads. Dairy  Allowed: Low-fat  cheeses: nonfat or low-fat cottage cheese (1% or 2% fat), cheeses made with part skim milk, such as mozzarella, farmers, string, or ricotta. (Cheeses should be labeled no more than 2 to 6 grams fat per oz.). Skim (or 1%) milk: liquid, powdered, or evaporated. Buttermilk made with low-fat milk. Drinks made with skim or low-fat milk or cocoa. Chocolate milk or cocoa made with skim or low-fat (1%) milk. Nonfat or low-fat yogurt.  Avoid: Whole milk cheeses, including colby, cheddar, mu[place], Monterey Jack, [place], [place], [place], American, Swiss, and blue. Creamed cottage cheese, cream cheese. Whole milk and whole milk products, including buttermilk or yogurt made from whole milk, drinks made from whole milk. Condensed milk, evaporated whole milk, and 2% milk. Soups and Combination Foods  Allowed: Low-fat low-sodium soups: broth, dehydrated soups, homemade broth, soups with the fat removed, homemade cream soups made with skim or low-fat milk. Low-fat spaghetti, lasagna, chili, and Spanish rice if low-fat ingredients and low-fat cooking techniques are used.  Avoid: Cream soups made with whole milk, cream, or high-fat cheese. All other soups. Desserts and Sweets  Allowed: Sherbet, fruit ices, gelatins, meringues, and angel food cake. Homemade desserts with recommended fats, oils, and milk products. Jam, jelly, honey, marmalade, sugars, and syrups. Pure sugar candy, such as gum drops, hard candy, jelly beans, marshmallows, mints, and small amounts of dark chocolate.  Avoid: Commercially prepared  cakes, pies, cookies, frosting, pudding, or mixes for these products. Desserts containing whole milk products, chocolate, coconut, lard, palm oil, or palm kernel oil. Ice cream or ice cream drinks. Candy that contains chocolate, coconut, butter, hydrogenated fat, or unknown ingredients. Buttered syrups. Fats and Oils  Allowed: Vegetable oils: safflower, sunflower, corn, soybean, cottonseed, sesame, canola, olive,  or peanut. Non-hydrogenated margarines. Salad dressing or mayonnaise: homemade or commercial, made with a recommended oil. Low or nonfat salad dressing or mayonnaise.  Limit added fats and oils to 6 to 8 tsp per day (includes fats used in cooking, baking, salads, and spreads on bread). Remember to count the "hidden fats" in foods.  Avoid: Solid fats and shortenings: butter, lard, salt pork, bacon drippings. Gravy containing meat fat, shortening, or suet. Cocoa butter, coconut. Coconut oil, palm oil, palm kernel oil, or hydrogenated oils: these ingredients are often used in bakery products, nondairy creamers, whipped toppings, candy, and commercially fried foods. Read labels carefully. Salad dressings made of unknown oils, sour cream, or cheese, such as blue cheese and Roquefort. Cream, all kinds: half-and-half, light, heavy, or whipping. Sour cream or cream cheese (even if "light" or low-fat). Nondairy cream substitutes: coffee creamers and sour cream substitutes made with palm, palm kernel, hydrogenated oils, or coconut oil. Beverages  Allowed: Coffee (regular or decaffeinated), tea. Diet carbonated beverages, mineral water. Alcohol: Check with your caregiver. Moderation is recommended.  Avoid: Whole milk, regular sodas, and juice drinks with added sugar. Condiments  Allowed: All seasonings and condiments. Cocoa powder. "Cream" sauces made with recommended ingredients.  Avoid: Carob powder made with hydrogenated fats. SAMPLE MENU Breakfast   cup orange juice   cup oatmeal  1 slice toast  1 tsp margarine  1 cup skim milk Lunch  [place]urkey sandwich with 2 oz [place], 2 slices bread  Lettuce and tomato slices  Fresh fruit  Carrot sticks  Coffee or tea Snack  Fresh fruit or low-fat crackers Dinner  3 oz lean ground beef  1 baked potato  1 tsp margarine   cup asparagus  Lettuce salad  1 tbs non-creamy dressing   cup peach slices  1 cup skim milk Document Released:  06/30/2008 Document Revised: 03/22/2012 Document Reviewed: 12/15/2011 ExitCare Patient Information 2015 [employer], [place]. This information is not intended to replace advice given to you by your health care provider. Make sure you discuss any questions you have with your health care provider.   Antibiotic Medication Antibiotic medicine helps fight germs. Germs cause infections. This type of medicine will not work for colds, flu, or other viral infections. Tell your doctor if you:  Are allergic to any medicines.  Are pregnant or are trying to get pregnant.  Are taking other medicines.  Have other medical problems. HOME CARE  Take your medicine with a glass of water or food as told by your doctor.  Take the medicine as told. Finish them even if you start to feel better.  Do not give your medicine to other people.  Do not use your medicine in the future for a different infection.  Ask your doctor about which side effects to watch for.  Try not to miss any doses. If you miss a dose, take it as soon as possible. If it is almost time for your next dose, and your dosing schedule is:  Two doses a day, take the missed dose and the next dose 5 to 6 hours later.  Three or more doses a day, take the missed dose and the  next dose 2 to 4 hours later, or double your next dose.  Then go back to your normal schedule. GET HELP RIGHT AWAY IF:   You get worse or do not get better within a few days.  The medicine makes you sick.  You develop a rash or any other side effects.  You have questions or concerns. MAKE SURE YOU:  Understand these instructions.  Will watch your condition.  Will get help right away if you are not doing well or get worse. Document Released: 06/30/2008 Document Revised: 12/14/2011 Document Reviewed: 08/27/2009 [facility] Patient Information 2015 [place], [place]. This information is not intended to replace advice given to you by your health care provider. Make sure you  discuss any questions you have with your health care provider.

## 2014-03-30 LAB — QUANTIFERON TB GOLD ASSAY (BLOOD)
INTERFERON GAMMA RELEASE ASSAY: NEGATIVE
Mitogen value: >10 IU/mL
Quantiferon Nil Value: 0.08 IU/mL
TB Ag value: 0.07 IU/mL
TB Antigen Minus Nil Value: 0 IU/mL

## 2014-03-30 LAB — PROTEIN ELECTROPH W RFLX QUANT IMMUNOGLOBULINS
ALBUMIN ELP: 50 % — AB (ref 55.8–66.1)
Alpha-1-Globulin: 5.1 % — ABNORMAL HIGH (ref 2.9–4.9)
Alpha-2-Globulin: 12.3 % — ABNORMAL HIGH (ref 7.1–11.8)
BETA 2: 6.8 % — AB (ref 3.2–6.5)
Beta Globulin: 5.4 % (ref 4.7–7.2)
Gamma Globulin: 20.4 % — ABNORMAL HIGH (ref 11.1–18.8)
M-Spike, %: NOT DETECTED g/dL
Total Protein ELP: 6.1 g/dL (ref 6.0–8.3)

## 2014-03-30 LAB — ROCKY MTN SPOTTED FVR AB, IGM-BLOOD: RMSF IgM: 0.54 IV (ref 0.00–0.89)

## 2014-03-30 LAB — HETEROPHILE AB, REFLEX TO TITER, BLOOD: HETEROPHILE AB SCREEN: NEGATIVE

## 2014-03-30 LAB — ROCKY MTN SPOTTED FVR AB, IGG-BLOOD: RMSF IGG: 0.12 IV

## 2014-03-30 LAB — ANA: Anti Nuclear Antibody(ANA): NEGATIVE

## 2014-03-31 LAB — EHRLICHIA ANTIBODY PANEL: E chaffeensis (HGE) Ab, IgM: <1:20 {titer}

## 2014-03-31 NOTE — Discharge Summary (Signed)
Physician Discharge Summary  Tonya [name] JGG:[id number] DOB: [birth year] DOA: 03/27/2014  PCP: [name], MD  Admit date: 03/27/2014 Discharge date: 03/29/2014  Time spent: <30 minutes  Recommendations for Outpatient Follow-up:  Follow-up Information   Follow up with [name], MD. (in 1-2weeks,call for appt upon discharge)    Specialty:  Internal Medicine   Contact information:   [address] [place] [address] [phone]       Follow up with [name], MD. (next week, call for appt upon discharge)    Specialty:  Infectious Diseases   Contact information:   [address] [place] [address] [phone]        Discharge Diagnoses:  Principal Problem:   Chest pain Active Problems:   Febrile illness   Thrombocytopenia   Anemia   Hypokalemia   Discharge Condition: Improved/stable  Diet recommendation: Regular  Filed Weights   03/27/14 1651 03/28/14 0538 03/29/14 0525  Weight: 60.464 kg (133 lb 4.8 oz) 61.774 kg (136 lb 3 oz) 60.4 kg (133 lb 2.5 oz)    History of present illness:  Tonya [name] is a 28 y.o. female with no significant past medical history presenting to the emergency department with complaints of chest pain and fever. She states having a history of intermittent chest pain which she attributes to gastroesophageal reflux disease. 6 bands episode of chest pain yesterday located parasternal region characterized as dull, achy, and nonradiating. This was not associated with nausea, vomiting or shortness of breath neither was it associated with physical exertion. She also developed fever, chills, malaise, myalgias, generalized weakness, fatigue. She presented to the urgent care Center today with the above-mentioned complaints. She was found to have a temperature of 102.2. She reports having sick contacts from her daughter who has URI symptoms. Initial lab work in the emergency room showed a troponin within normal limits, white count  of 5100. EKG revealed ST segment depression as cardiology was consulted. Cardiology recommending admission to the medicine service, follow troponins overnight. We'll check a transthoracic echocardiogram.she was admitted for further eval and management.    Hospital Course:  Chest pain  -As discussed above upon admission Cardiac enzymes were cycled and came back negative, 2-D echo with EF of 55-60%, normal wall motion -She has remained chest pain-free and per cardiology no further workup recommended. Active Problems:  Febrile illness  -Unclear etiology-HIV nonreactive, acute hepatitis panel negative, rheumatoid factor within normal limits, ESR unremarkable and mono screen negative -2-D echo within normal limits  -EBV VCA IgM and IgG elevated, CMV IgG elevated and the IgM within normal limits -Patient has remained afebrile since initial temp on admission>> and she feels much better today -6/25 blood cultures in process, and also pending- protein electrophoresis, ANA, quantitative CMV, early care antibody panel, heterophile antibody reflect [name], [facility] spotted fever serologies>> ordered today per Dr. [name] and she recommends for patient to be DC'd after this labs were drawn, and to follow up outpatient in her office for results and further management as clinically appropriate -Dr. [name] recommends to give patient a prescription for doxycycline which she is to only start taking if she starts having fevers again. She is to keep a diary of her fevers and followup in the clinic next week as directed  Thrombocytopenia/Anemia  -Patient with no gross bleeding follow up  -Thrombocytopenia and leukopenia possibly secondary to underlying etiology of the fevers -Outpatient followup with ID for pending studies and further management as appropriate -If persisting on  outpatient followup and all pending negative will recommend a referral to heme outpatient.  Hypokalemia  -resolved, k  replaced  Leukopenia -Resolved likely secondary to viral Etiology  Procedures:  2-D echo Study Conclusions  - Left ventricle: The cavity size was normal. Systolic function was normal. The estimated ejection fraction was in the range of 55% to 60%. Wall motion was normal; there were no regional wall motion abnormalities. - Mitral valve: There was trivial regurgitation.  Consultations:  Cardiology  Infectious disease  Discharge Exam: Filed Vitals:   03/29/14 1355  BP: 93/61  Pulse: 81  Temp: 98 F (36.7 C)  Resp: 16  Exam:  General: alert & oriented x 3 In NAD  Cardiovascular: RRR, nl S1 s2  Respiratory: CTAB  Abdomen: soft +BS NT/ND, no masses palpable  Extremities: No cyanosis and no edema    Discharge Instructions You were cared for by a hospitalist during your hospital stay. If you have any questions about your discharge medications or the care you received while you were in the hospital after you are discharged, you can call the unit and asked to speak with the hospitalist on call if the hospitalist that took care of you is not available. Once you are discharged, your primary care physician will handle any further medical issues. Please note that NO REFILLS for any discharge medications will be authorized once you are discharged, as it is imperative that you return to your primary care physician (or establish a relationship with a primary care physician if you do not have one) for your aftercare needs so that they can reassess your need for medications and monitor your lab values.      Discharge Instructions   Diet general    Complete by:  As directed      Increase activity slowly    Complete by:  As directed             Medication List    STOP taking these medications       acetaminophen 500 MG tablet  Commonly known as:  TYLENOL      TAKE these medications       cetirizine 10 MG tablet  Commonly known as:  ZYRTEC  Take 10 mg by mouth daily.      doxycycline 100 MG capsule  Commonly known as:  VIBRAMYCIN  Take 1 capsule (100 mg total) by mouth 2 (two) times daily.       No Known Allergies Follow-up Information   Follow up with [name], MD. (in 1-2weeks,call for appt upon discharge)    Specialty:  Internal Medicine   Contact information:   [address] [address] [phone]       Follow up with [name], MD. (next week, call for appt upon discharge)    Specialty:  Infectious Diseases   Contact information:   [place] [address] [phone]        The results of significant diagnostics from this hospitalization (including imaging, microbiology, ancillary and laboratory) are listed below for reference.    Significant Diagnostic Studies: Dg Chest 2 View  03/27/2014   CLINICAL DATA:  Chest pain.  EXAM: CHEST  2 VIEW  COMPARISON:  June 03, 2010.  FINDINGS: The heart size and mediastinal contours are within normal limits. Both lungs are clear. No pneumothorax or pleural effusion is noted. The visualized skeletal structures are unremarkable.  IMPRESSION: No acute cardiopulmonary abnormality seen.   Electronically Signed  By: Sabino Dick M.D.   On: 03/27/2014 12:14    Microbiology: Recent Results (from the past 240 hour(s))  CULTURE, GROUP A STREP     Status: None   Collection Time    03/27/14 11:50 AM      Result Value Ref Range Status   Specimen Description THROAT   Final   Special Requests NONE   Final   Culture     Final   Value: No Beta Hemolytic Streptococci Isolated     Performed at [facility]   Report Status 03/29/2014 FINAL   Final  CULTURE, BLOOD (ROUTINE X 2)     Status: None   Collection Time    03/27/14  3:45 PM      Result Value Ref Range Status   Specimen Description BLOOD RIGHT ARM   Final   Special Requests BOTTLES DRAWN AEROBIC AND ANAEROBIC 10 CC   Final   Culture  Setup Time     Final   Value: 03/27/2014 20:44      Performed at [facility]   Culture     Final   Value:        BLOOD CULTURE RECEIVED NO GROWTH TO DATE CULTURE WILL BE HELD FOR 5 DAYS BEFORE ISSUING A FINAL NEGATIVE REPORT     Performed at [facility]   Report Status PENDING   Incomplete  CULTURE, BLOOD (ROUTINE X 2)     Status: None   Collection Time    03/27/14  3:54 PM      Result Value Ref Range Status   Specimen Description BLOOD LEFT ARM   Final   Special Requests BOTTLES DRAWN AEROBIC AND ANAEROBIC 6CC   Final   Culture  Setup Time     Final   Value: 03/27/2014 20:43     Performed at [facility]   Culture     Final   Value:        BLOOD CULTURE RECEIVED NO GROWTH TO DATE CULTURE WILL BE HELD FOR 5 DAYS BEFORE ISSUING A FINAL NEGATIVE REPORT     Performed at [facility]   Report Status PENDING   Incomplete  CULTURE, BLOOD (ROUTINE X 2)     Status: None   Collection Time    03/29/14  6:30 AM      Result Value Ref Range Status   Specimen Description BLOOD RIGHT ARM   Final   Special Requests BOTTLES DRAWN AEROBIC ONLY 10CC   Final   Culture  Setup Time     Final   Value: 03/29/2014 12:33     Performed at [facility]   Culture     Final   Value:        BLOOD CULTURE RECEIVED NO GROWTH TO DATE CULTURE WILL BE HELD FOR 5 DAYS BEFORE ISSUING A FINAL NEGATIVE REPORT     Performed at [facility]   Report Status PENDING   Incomplete  CULTURE, BLOOD (ROUTINE X 2)     Status: None   Collection Time    03/29/14  6:45 AM      Result Value Ref Range Status   Specimen Description BLOOD LEFT HAND   Final   Special Requests BOTTLES DRAWN AEROBIC ONLY [facility]   Final   Culture  Setup Time     Final   Value: 03/29/2014 12:34     Performed at [facility]     Final  Value:        BLOOD CULTURE RECEIVED NO GROWTH TO DATE CULTURE WILL BE HELD FOR 5 DAYS BEFORE ISSUING A FINAL NEGATIVE REPORT     Performed at [facility]   Report Status PENDING   Incomplete      Labs: Basic Metabolic Panel:  Recent Labs Lab 03/27/14 1329 03/27/14 1820 03/28/14 0545  NA 138  --  142  K 3.1*  --  4.4  CL 103  --  110  CO2 19  --  24  GLUCOSE 100*  --  90  BUN 9  --  11  CREATININE 0.76 0.70 0.69  CALCIUM 8.2*  --  8.0*   Liver Function Tests:  Recent Labs Lab 03/27/14 1329  AST 19  ALT 14  ALKPHOS 54  BILITOT <0.2*  PROT 6.7  ALBUMIN 3.1*   No results found for this basename: LIPASE, AMYLASE,  in the last 168 hours No results found for this basename: AMMONIA,  in the last 168 hours CBC:  Recent Labs Lab 03/27/14 1329 03/27/14 1820 03/28/14 0545 03/29/14 0335 03/29/14 0625  WBC 5.1 3.7* 2.4*  --  4.3  NEUTROABS 3.4  --   --  2.0  --   HGB 11.5* 12.1 11.2*  --  10.8*  HCT 35.0* 38.0 35.3*  --  33.9*  MCV 78.0 79.5 81.0  --  78.7  PLT 92* 97* 82*  --  77*   Cardiac Enzymes:  Recent Labs Lab 03/27/14 1554 03/27/14 1820 03/27/14 2316 03/28/14 0545  TROPONINI <0.30 <0.30 <0.30 <0.30   BNP: BNP (last 3 results) No results found for this basename: PROBNP,  in the last 8760 hours CBG: No results found for this basename: GLUCAP,  in the last 168 hours     Signed:  Sheila Oats  [facility] 03/31/2014, 8:23 AM

## 2014-04-01 NOTE — ED Provider Notes (Signed)
Medical screening examination/treatment/procedure(s) were performed by non-physician practitioner and as supervising physician I was immediately available for consultation/collaboration.   Robert L Beaton, MD 04/01/14 1300 

## 2014-04-02 LAB — CULTURE, BLOOD (ROUTINE X 2)
Culture: NO GROWTH
Culture: NO GROWTH

## 2014-04-04 LAB — CULTURE, BLOOD (ROUTINE X 2)
Culture: NO GROWTH
Culture: NO GROWTH

## 2014-04-26 ENCOUNTER — Ambulatory Visit (INDEPENDENT_AMBULATORY_CARE_PROVIDER_SITE_OTHER): Payer: BC Managed Care – PPO | Admitting: Internal Medicine

## 2014-04-26 ENCOUNTER — Encounter: Payer: Self-pay | Admitting: Internal Medicine

## 2014-04-26 VITALS — BP 126/80 | HR 97 | Temp 98.1°F | Wt 135.0 lb

## 2014-04-26 DIAGNOSIS — R5381 Other malaise: Secondary | ICD-10-CM

## 2014-04-26 DIAGNOSIS — D696 Thrombocytopenia, unspecified: Secondary | ICD-10-CM

## 2014-04-26 DIAGNOSIS — R509 Fever, unspecified: Secondary | ICD-10-CM

## 2014-04-26 DIAGNOSIS — R51 Headache: Secondary | ICD-10-CM

## 2014-04-26 DIAGNOSIS — R5383 Other fatigue: Principal | ICD-10-CM

## 2014-04-26 LAB — IRON AND TIBC
%SAT: 15 % — ABNORMAL LOW (ref 20–55)
Iron: 52 ug/dL (ref 42–145)
TIBC: 339 ug/dL (ref 250–470)
UIBC: 287 ug/dL (ref 125–400)

## 2014-04-26 MED ORDER — DEXAMETHASONE 2 MG PO TABS: 4.0000 mg | ORAL_TABLET | Freq: Two times a day (BID) | ORAL | Status: DC

## 2014-04-26 NOTE — Progress Notes (Signed)
Subjective:    Patient ID: Tonya [name], female    DOB: [birth year], 28 y.o.   MRN: [id number]  HPIHdieu [name] is a 28 y.o. female with history of firbromyalgia per pt, who reports that she has had 1 year worth of intermittent fevers who was admitted for FUO, leukopenia/thrombocytopenia in late June.  Her fevers last anywhere from 2-5 days that recurs roughly every month. She states that in addition to feeling poorly having chills, fevers, malaise, can have exacerbation of her baseline arthralgias. She was admitted in the setting of having fevers, worsening of her baseline chest pain. Found to have leukopenia. She reports one of her children having a cough, but no fevers. No one in the family has similar illness. As a child, she reports having frequent illness that her mother would treat. No recent tick bites, no recent travel. She was discharged on doxycycline and felt better. No fevers but still has ongoing daily chronic headache with occ migraines. She reports being a light sleeper and having poor sleep since she has  3915 month old infant.+ fatigue in addition to headache. She takes ibuprofen or tylenol 4 x per week. Caffeine alleviates her ha.  No Known Allergies Current Outpatient Prescriptions on File Prior to Visit  Medication Sig Dispense Refill  . cetirizine (ZYRTEC) 10 MG tablet Take 10 mg by mouth daily.      Marland Kitchen. doxycycline (VIBRAMYCIN) 100 MG capsule Take 1 capsule (100 mg total) by mouth 2 (two) times daily.  14 capsule  0   No current facility-administered medications on file prior to visit.   Active Ambulatory Problems    Diagnosis Date Noted  . SHOULDER PAIN, RIGHT 07/08/2010  . MUSCLE PAIN 07/08/2010  . PARESTHESIA 07/08/2010  . HEADACHE 07/08/2010  . Constipation 08/19/2011  . Internal hemorrhoids without mention of complication 08/19/2011  . Family history of malignant neoplasm of gastrointestinal tract 08/19/2011  . Chest pain 03/27/2014  . Febrile illness 03/27/2014  .  Thrombocytopenia 03/27/2014  . Anemia 03/27/2014  . Hypokalemia 03/27/2014   Resolved Ambulatory Problems    Diagnosis Date Noted  . No Resolved Ambulatory Problems   Past Medical History  Diagnosis Date  . GERD (gastroesophageal reflux disease)   . Migraines   . Headache       Review of Systems 10 point ros is negative except for HA and fatigue    Objective:   Physical Exam Blood pressure 126/80, pulse 97, temperature 98.1 F (36.7 C), temperature source Oral, weight 135 lb (61.236 kg), last menstrual period 04/03/2014, unknown if currently breastfeeding. Physical Exam  Constitutional:  oriented to person, place, and time. appears well-developed and well-nourished. No distress.  HENT:  Mouth/Throat: Oropharynx is clear and moist. No oropharyngeal exudate.  Cardiovascular: Normal rate, regular rhythm and normal heart sounds. Exam reveals no gallop and no friction rub.  No murmur heard.  Pulmonary/Chest: Effort normal and breath sounds normal. No respiratory distress.  has no wheezes.  Abdominal: Soft. Bowel sounds are normal.  exhibits no distension. There is no tenderness.  Lymphadenopathy: no cervical adenopathy.  Neurological: alert and oriented to person, place, and time.  Skin: Skin is warm and dry. No rash noted. No erythema.  Psychiatric: a normal mood and affect.  behavior is normal.       Assessment & Plan:  fuo = appears resolved, but will check ig g, ig m, and ig a to see if she has deficiency that would explain frequent illness.have her keep fever diary  Thrombocytopenia = hopefully resolved. Will repeat cbc  Chronic headache = may have an element of nsaid rebound ha. Will give her dex taper  Insomnia = also likely to contribute to daily ha. Recommended that she seek spouses help in caring for infant in evening so that she can aim to get 8hr of sleep rather than 5-6 hr per night.

## 2014-04-27 LAB — IGG, IGA, IGM
IgA: 415 mg/dL — ABNORMAL HIGH (ref 69–380)
IgG (Immunoglobin G), Serum: 1400 mg/dL (ref 690–1700)
IgM, Serum: 233 mg/dL (ref 52–322)

## 2014-04-27 LAB — FERRITIN: Ferritin: 21 ng/mL (ref 10–291)

## 2014-04-27 LAB — FOLATE: FOLATE: 14.1 ng/mL

## 2014-04-27 LAB — VITAMIN B12: Vitamin B-12: 949 pg/mL — ABNORMAL HIGH (ref 211–911)

## 2014-05-08 ENCOUNTER — Telehealth: Payer: Self-pay | Admitting: *Deleted

## 2014-05-08 NOTE — Telephone Encounter (Signed)
Patient called to advise that she is having fevers since Saturday 05/05/14 (103) and she has a white patch in her throat and wanted Dr Drue SecondSnider to call her as she has questions. She wants to come in to have labs done while she is having problems and wants to know what Dr Drue SecondSnider thinks. Advised her will check with the doctor and see if she will call her asap. Either way I will call her back tomorrow.

## 2014-05-09 ENCOUNTER — Other Ambulatory Visit: Payer: Self-pay | Admitting: Internal Medicine

## 2014-05-09 ENCOUNTER — Ambulatory Visit: Payer: BC Managed Care – PPO

## 2014-05-09 DIAGNOSIS — J029 Acute pharyngitis, unspecified: Secondary | ICD-10-CM

## 2014-05-09 DIAGNOSIS — G44029 Chronic cluster headache, not intractable: Secondary | ICD-10-CM

## 2014-05-09 LAB — CBC WITH DIFFERENTIAL/PLATELET
BASOS PCT: 1 % (ref 0–1)
Basophils Absolute: 0 10*3/uL (ref 0.0–0.1)
EOS ABS: 0 10*3/uL (ref 0.0–0.7)
Eosinophils Relative: 1 % (ref 0–5)
HCT: 37.2 % (ref 36.0–46.0)
Hemoglobin: 12.1 g/dL (ref 12.0–15.0)
Lymphocytes Relative: 44 % (ref 12–46)
Lymphs Abs: 1.6 10*3/uL (ref 0.7–4.0)
MCH: 25.9 pg — ABNORMAL LOW (ref 26.0–34.0)
MCHC: 32.5 g/dL (ref 30.0–36.0)
MCV: 79.5 fL (ref 78.0–100.0)
Monocytes Absolute: 0.3 10*3/uL (ref 0.1–1.0)
Monocytes Relative: 8 % (ref 3–12)
Neutro Abs: 1.7 10*3/uL (ref 1.7–7.7)
Neutrophils Relative %: 46 % (ref 43–77)
PLATELETS: 155 10*3/uL (ref 150–400)
RBC: 4.68 MIL/uL (ref 3.87–5.11)
RDW: 14.7 % (ref 11.5–15.5)
WBC: 3.6 10*3/uL — ABNORMAL LOW (ref 4.0–10.5)

## 2014-05-11 LAB — CULTURE, GROUP A STREP: ORGANISM ID, BACTERIA: NORMAL

## 2014-05-16 ENCOUNTER — Telehealth: Payer: Self-pay | Admitting: *Deleted

## 2014-05-16 NOTE — Telephone Encounter (Signed)
Patient called to cancel her appt advised she does not see why she needs it they can not seem to find anything wrong and she will call back if she has any more trouble.

## 2014-08-02 ENCOUNTER — Ambulatory Visit: Payer: BC Managed Care – PPO | Admitting: Internal Medicine

## 2014-08-06 ENCOUNTER — Encounter: Payer: Self-pay | Admitting: Internal Medicine

## 2014-11-22 ENCOUNTER — Emergency Department (INDEPENDENT_AMBULATORY_CARE_PROVIDER_SITE_OTHER)
Admission: EM | Admit: 2014-11-22 | Discharge: 2014-11-22 | Disposition: A | Discharge status: Home / Self Care | Payer: BLUE CROSS/BLUE SHIELD | Source: Home / Self Care | Attending: Family Medicine | Admitting: Family Medicine

## 2014-11-22 ENCOUNTER — Emergency Department (HOSPITAL_COMMUNITY): Payer: BLUE CROSS/BLUE SHIELD

## 2014-11-22 ENCOUNTER — Encounter (HOSPITAL_COMMUNITY): Payer: Self-pay | Admitting: Emergency Medicine

## 2014-11-22 DIAGNOSIS — R053 Chronic cough: Secondary | ICD-10-CM

## 2014-11-22 DIAGNOSIS — R05 Cough: Secondary | ICD-10-CM

## 2014-11-22 DIAGNOSIS — E611 Iron deficiency: Secondary | ICD-10-CM

## 2014-11-22 MED ORDER — IPRATROPIUM BROMIDE 0.06 % NA SOLN: 2.0000 | Freq: Four times a day (QID) | NASAL | Status: DC

## 2014-11-22 MED ORDER — OMEPRAZOLE 40 MG PO CPDR
40.0000 mg | DELAYED_RELEASE_CAPSULE | Freq: Every day | ORAL | Status: DC
Start: 2014-11-22 — End: 2015-07-25

## 2014-11-22 NOTE — ED Notes (Signed)
Pt states she has been suffering from head and chest congestion, coughing, sneezing, sore throat, and SOB on and off all winter.  Pt states she has used Allegra, Claritin and Mucinex with little or no relief.

## 2014-11-22 NOTE — Discharge Instructions (Signed)
Thank you for coming in today. Take omeprazole and use Atrovent nasal spray Follow-up with a primary care doctor Return as needed  Gastroesophageal Reflux Disease, Adult Gastroesophageal reflux disease (GERD) happens when acid from your stomach flows up into the esophagus. When acid comes in contact with the esophagus, the acid causes soreness (inflammation) in the esophagus. Over time, GERD may create small holes (ulcers) in the lining of the esophagus. CAUSES   Increased body weight. This puts pressure on the stomach, making acid rise from the stomach into the esophagus.  Smoking. This increases acid production in the stomach.  Drinking alcohol. This causes decreased pressure in the lower esophageal sphincter (valve or ring of muscle between the esophagus and stomach), allowing acid from the stomach into the esophagus.  Late evening meals and a full stomach. This increases pressure and acid production in the stomach.  A malformed lower esophageal sphincter. Sometimes, no cause is found. SYMPTOMS   Burning pain in the lower part of the mid-chest behind the breastbone and in the mid-stomach area. This may occur twice a week or more often.  Trouble swallowing.  Sore throat.  Dry cough.  Asthma-like symptoms including chest tightness, shortness of breath, or wheezing. DIAGNOSIS  Your caregiver may be able to diagnose GERD based on your symptoms. In some cases, X-rays and other tests may be done to check for complications or to check the condition of your stomach and esophagus. TREATMENT  Your caregiver may recommend over-the-counter or prescription medicines to help decrease acid production. Ask your caregiver before starting or adding any new medicines.  HOME CARE INSTRUCTIONS   Change the factors that you can control. Ask your caregiver for guidance concerning weight loss, quitting smoking, and alcohol consumption.  Avoid foods and drinks that make your symptoms worse, such  as:  Caffeine or alcoholic drinks.  Chocolate.  Peppermint or mint flavorings.  Garlic and onions.  Spicy foods.  Citrus fruits, such as oranges, lemons, or limes.  Tomato-based foods such as sauce, chili, salsa, and pizza.  Fried and fatty foods.  Avoid lying down for the 3 hours prior to your bedtime or prior to taking a nap.  Eat small, frequent meals instead of large meals.  Wear loose-fitting clothing. Do not wear anything tight around your waist that causes pressure on your stomach.  Raise the head of your bed 6 to 8 inches with wood blocks to help you sleep. Extra pillows will not help.  Only take over-the-counter or prescription medicines for pain, discomfort, or fever as directed by your caregiver.  Do not take aspirin, ibuprofen, or other nonsteroidal anti-inflammatory drugs (NSAIDs). SEEK IMMEDIATE MEDICAL CARE IF:   You have pain in your arms, neck, jaw, teeth, or back.  Your pain increases or changes in intensity or duration.  You develop nausea, vomiting, or sweating (diaphoresis).  You develop shortness of breath, or you faint.  Your vomit is green, yellow, black, or looks like coffee grounds or blood.  Your stool is red, bloody, or black. These symptoms could be signs of other problems, such as heart disease, gastric bleeding, or esophageal bleeding. MAKE SURE YOU:   Understand these instructions.  Will watch your condition.  Will get help right away if you are not doing well or get worse. Document Released: 07/01/2005 Document Revised: 12/14/2011 Document Reviewed: 04/10/2011 [facility] Patient Information 2015 [place], [place]. This information is not intended to replace advice given to you by your health care provider. Make sure you discuss any questions  you have with your health care provider.   PRIMARY CARE Merchant navy officer at [employer] [address]  [place], [place] [phone]  Fax  [phone]  Nature conservation officer at [facility] [address]  [place], [place] [phone]  Fax [phone]  Nature conservation officer at [place] [facility] [phone] [address] [place], [place] [phone]  Fax [phone]  [facility] at [facility] [address]  [place], [place] [phone]  Fax [phone]  [facility] [address] [place] [address] [place], [place] [phone]  Fax [phone]  [facility] at [facility] [address] [place], [place] [phone]  Fax [phone]   [facility] @ [place] [address] [place] [address] Phone: [phone]   [facility] @ [facility] [address] [place] [address] Phone: [phone]   [facility] Medicine @ [place] [address] [place] [address] Phone: [phone]   [facility] @ [place] [address] [place] [address] Phone: [phone]   [facility] @ [place] [address] [place] [address] Phone: [phone] Fax: [phone]   [facility] @ [place] [address] [place] [address] Phone: [phone]   Dr. Maryelizabeth Rowan [address] [place] [address] [phone]

## 2014-11-22 NOTE — ED Provider Notes (Signed)
Tonya [name] is a 29 y.o. female who presents to Urgent Care today for Nasal congestion throat clearing and cough. Symptoms present now for multiple months. Patient states that she is always sick. She has tried some over-the-counter medications which have not helped much. In June she was hospitalized for fever. At that time her EBV antibodies were elevated both IgG and IgM.she denies any current fevers or chills nausea vomiting or diarrhea or trouble breathing. She does not have a primary care provider.  She notes a burning sensation in her chest sometimes after meals and a sour taste in her mouth in the morning.   Past Medical History  Diagnosis Date  . GERD (gastroesophageal reflux disease)   . Migraines     "few times/year" (03/27/2014)  . Headache     "~ weekly" (03/27/2014)   Past Surgical History  Procedure Laterality Date  . Dilation and curettage of uterus  2006   History  Substance Use Topics  . Smoking status: Never Smoker   . Smokeless tobacco: Never Used  . Alcohol Use: Not on file   ROS as above Medications: No current facility-administered medications for this encounter.   Current Outpatient Prescriptions  Medication Sig Dispense Refill  . cetirizine (ZYRTEC) 10 MG tablet Take 10 mg by mouth daily.    Marland Kitchen. dextromethorphan-guaiFENesin (MUCINEX DM) 30-600 MG per 12 hr tablet Take 1 tablet by mouth 2 (two) times daily.    . fexofenadine (ALLEGRA) 180 MG tablet Take 180 mg by mouth daily.    Marland Kitchen. dexamethasone (DECADRON) 2 MG tablet Take 2 tablets (4 mg total) by mouth 2 (two) times daily with a meal. X 4 days, then 1 tab twice a day x 4 days until complete 22 tablet 0  . doxycycline (VIBRAMYCIN) 100 MG capsule Take 1 capsule (100 mg total) by mouth 2 (two) times daily. 14 capsule 0  . ipratropium (ATROVENT) 0.06 % nasal spray Place 2 sprays into both nostrils 4 (four) times daily. 15 mL 1  . omeprazole (PRILOSEC) 40 MG capsule Take 1 capsule (40 mg total) by mouth daily. 30 capsule  1   No Known Allergies   Exam:  BP 106/70 mmHg  Pulse 81  Temp(Src) 98.3 F (36.8 C) (Oral)  Resp 14  SpO2 100%  LMP 10/08/2014 (Approximate) Gen: Well NAD HEENT: EOMI,  MMM posterior pharynx with cobblestoning. Normal tympanic membranes bilaterally. Clear nasal discharge. Frequent throat clearing noises Lungs: Normal work of breathing. CTABL Heart: RRR no MRG Abd: NABS, Soft. Nondistended, Nontender Exts: Brisk capillary refill, warm and well perfused.   No results found for this or any previous visit (from the past 24 hour(s)). No results found.  Assessment and Plan: 29 y.o. female with chronic cough likely due to acid reflux or postnasal drip. Treatment with Atrovent and omeprazole. I ordered a chest x-ray to evaluate for chronic cough or for our x-ray machine is currently malfunctioning. I offered delay in x-ray, or outpatient x-ray. Patient declined stating that she will follow-up as needed. Recommend patient follow-up with primary care provider in the near future.  Discussed warning signs or symptoms. Please see discharge instructions. Patient expresses understanding.     Rodolph BongEvan S Rutherford Alarie, MD 11/22/14 [phone]

## 2015-01-20 ENCOUNTER — Emergency Department (HOSPITAL_COMMUNITY)
Admission: EM | Admit: 2015-01-20 | Discharge: 2015-01-20 | Disposition: A | Discharge status: Home / Self Care | Payer: BLUE CROSS/BLUE SHIELD | Source: Home / Self Care | Attending: Family Medicine | Admitting: Family Medicine

## 2015-01-20 ENCOUNTER — Encounter (HOSPITAL_COMMUNITY): Payer: Self-pay

## 2015-01-20 DIAGNOSIS — J301 Allergic rhinitis due to pollen: Secondary | ICD-10-CM | POA: Diagnosis not present

## 2015-01-20 DIAGNOSIS — M940 Chondrocostal junction syndrome [Tietze]: Secondary | ICD-10-CM | POA: Diagnosis not present

## 2015-01-20 LAB — POCT URINALYSIS DIP (DEVICE)
Bilirubin Urine: NEGATIVE
Glucose, UA: NEGATIVE mg/dL
HGB URINE DIPSTICK: NEGATIVE
KETONES UR: NEGATIVE mg/dL
Leukocytes, UA: NEGATIVE
NITRITE: NEGATIVE
PH: 6.5 (ref 5.0–8.0)
PROTEIN: NEGATIVE mg/dL
Specific Gravity, Urine: 1.015 (ref 1.005–1.030)
UROBILINOGEN UA: 0.2 mg/dL (ref 0.0–1.0)

## 2015-01-20 LAB — POCT PREGNANCY, URINE: PREG TEST UR: NEGATIVE

## 2015-01-20 NOTE — ED Provider Notes (Signed)
CSN: [id number]     Arrival date & time 01/20/15  1317 History   First MD Initiated Contact with Patient 01/20/15 1341     Chief Complaint  Patient presents with  . Flank Pain   (Consider location/radiation/quality/duration/timing/severity/associated sxs/prior Treatment) HPI Comments: 29 year old female complaining of allergy symptoms, upper respiratory congestion, cough, PND and morning phlegm in her throat. She is also complaining of fatigue and general lack of energy. Her second concern is that of left lateral chest wall/back pain. It is worse with movement and sometimes cough. This started last PM.   Past Medical History  Diagnosis Date  . GERD (gastroesophageal reflux disease)   . Migraines     "few times/year" (03/27/2014)  . Headache     "~ weekly" (03/27/2014)   Past Surgical History  Procedure Laterality Date  . Dilation and curettage of uterus  2006   Family History  Problem Relation Age of Onset  . Hypertension Father   . Hyperlipidemia Father   . Gout Father   . Colon cancer Mother     Dx at 23  . Migraines Mother   . Seizures Brother   . Irritable bowel syndrome Maternal Grandmother   . Irritable bowel syndrome Mother   . Other      Patient does not know if her family has had heart trouble due to most of family being in [place] with less access to healthcare   History  Substance Use Topics  . Smoking status: Never Smoker   . Smokeless tobacco: Never Used  . Alcohol Use: Not on file   OB History    Gravida Para Term Preterm AB TAB SAB Ectopic Multiple Living   Review of Systems  Constitutional: Positive for activity change. Negative for fever, appetite change and fatigue.  HENT: Positive for congestion, postnasal drip and rhinorrhea. Negative for ear discharge, ear pain, sore throat and trouble swallowing.   Eyes: Negative.   Respiratory: Positive for cough and shortness of breath.   Cardiovascular: Positive for chest pain.   Gastrointestinal: Negative.   Genitourinary: Negative.   Neurological: Positive for light-headedness.    Allergies  Review of patient's allergies indicates no known allergies.  Home Medications   Prior to Admission medications   Medication Sig Start Date End Date Taking? Authorizing Provider  cetirizine (ZYRTEC) 10 MG tablet Take 10 mg by mouth daily.    Historical Provider, MD  dexamethasone (DECADRON) 2 MG tablet Take 2 tablets (4 mg total) by mouth 2 (two) times daily with a meal. X 4 days, then 1 tab twice a day x 4 days until complete 04/26/14   Judyann Munson, MD  dextromethorphan-guaiFENesin [facility] DM) 30-600 MG per 12 hr tablet Take 1 tablet by mouth 2 (two) times daily.    Historical Provider, MD  doxycycline (VIBRAMYCIN) 100 MG capsule Take 1 capsule (100 mg total) by mouth 2 (two) times daily. 03/29/14   Kela Millin, MD  fexofenadine (ALLEGRA) 180 MG tablet Take 180 mg by mouth daily.    Historical Provider, MD  ipratropium (ATROVENT) 0.06 % nasal spray Place 2 sprays into both nostrils 4 (four) times daily. 11/22/14   Rodolph Bong, MD  omeprazole (PRILOSEC) 40 MG capsule Take 1 capsule (40 mg total) by mouth daily. 11/22/14   Rodolph Bong, MD   BP 104/72 mmHg  Pulse 87  Temp(Src) 98 F (36.7 C) (Oral)  Resp 18  SpO2 100% Physical Exam  Constitutional: She is oriented to person, place, and time. She appears well-developed and well-nourished. No distress.  HENT:  Mouth/Throat: No oropharyngeal exudate.  Bilateral TMs are normal Oropharynx with minor erythema, cobblestoning and clear PND  Eyes: Conjunctivae and EOM are normal.  Neck: Normal range of motion. Neck supple.  Cardiovascular: Normal rate, regular rhythm and normal heart sounds.   Pulmonary/Chest: Effort normal and breath sounds normal. No respiratory distress. She has no wheezes. She has no rales. She exhibits tenderness.  Patient takes frequent sighs during the interview and exam. There is marked  reproducible chest wall tender involving the lower anterolateral costal margin. This or produces the pain for which she has originally complain.  Abdominal: Soft. Bowel sounds are normal. She exhibits no distension and no mass. There is no rebound and no guarding.  Musculoskeletal:  Renae Fickleaul range of motion of the spine. Full flexion and extension without pain to the spine or torso.  Lymphadenopathy:    She has no cervical adenopathy.  Neurological: She is alert and oriented to person, place, and time. She exhibits normal muscle tone.  Skin: Skin is warm and dry.  Psychiatric: She is slowed. She exhibits a depressed mood.  Nursing note and vitals reviewed.   ED Course  Procedures (including critical care time) Labs Review Labs Reviewed  POCT URINALYSIS DIP (DEVICE)  POCT PREGNANCY, URINE   Results for orders placed or performed during the hospital encounter of 01/20/15  POCT urinalysis dip (device)  Result Value Ref Range   Glucose, UA NEGATIVE NEGATIVE mg/dL   Bilirubin Urine NEGATIVE NEGATIVE   Ketones, ur NEGATIVE NEGATIVE mg/dL   Specific Gravity, Urine 1.015 1.005 - 1.030   Hgb urine dipstick NEGATIVE NEGATIVE   pH 6.5 5.0 - 8.0   Protein, ur NEGATIVE NEGATIVE mg/dL   Urobilinogen, UA 0.2 0.0 - 1.0 mg/dL   Nitrite NEGATIVE NEGATIVE   Leukocytes, UA NEGATIVE NEGATIVE  Pregnancy, urine POC  Result Value Ref Range   Preg Test, Ur NEGATIVE NEGATIVE     Imaging Review No results found.   MDM   1. Allergic rhinitis due to pollen   2. Costochondritis    Recommend taking Zyrtec or Allegra on a daily basis for allergies Start Rhinocort or Flonase nasal spray daily Drink plenty of fluids stay well hydrated Ibuprofen as needed for chest wall pain Ice to sore areas    Hayden Rasmussenavid Aleeah Greeno, NP 01/20/15 1454

## 2015-01-20 NOTE — Discharge Instructions (Signed)
Allergic Rhinitis Recommend taking Zyrtec or Allegra on a daily basis for allergies Start Rhinocort or Flonase nasal spray daily Drink plenty of fluids stay well hydrated  Allergic rhinitis is when the mucous membranes in the nose respond to allergens. Allergens are particles in the air that cause your body to have an allergic reaction. This causes you to release allergic antibodies. Through a chain of events, these eventually cause you to release histamine into the blood stream. Although meant to protect the body, it is this release of histamine that causes your discomfort, such as frequent sneezing, congestion, and an itchy, runny nose.  CAUSES  Seasonal allergic rhinitis (hay fever) is caused by pollen allergens that may come from grasses, trees, and weeds. Year-round allergic rhinitis (perennial allergic rhinitis) is caused by allergens such as house dust mites, pet dander, and mold spores.  SYMPTOMS   Nasal stuffiness (congestion).  Itchy, runny nose with sneezing and tearing of the eyes. DIAGNOSIS  Your health care provider can help you determine the allergen or allergens that trigger your symptoms. If you and your health care provider are unable to determine the allergen, skin or blood testing may be used. TREATMENT  Allergic rhinitis does not have a cure, but it can be controlled by:  Medicines and allergy shots (immunotherapy).  Avoiding the allergen. Hay fever may often be treated with antihistamines in pill or nasal spray forms. Antihistamines block the effects of histamine. There are over-the-counter medicines that may help with nasal congestion and swelling around the eyes. Check with your health care provider before taking or giving this medicine.  If avoiding the allergen or the medicine prescribed do not work, there are many new medicines your health care provider can prescribe. Stronger medicine may be used if initial measures are ineffective. Desensitizing injections can be  used if medicine and avoidance does not work. Desensitization is when a patient is given ongoing shots until the body becomes less sensitive to the allergen. Make sure you follow up with your health care provider if problems continue. HOME CARE INSTRUCTIONS It is not possible to completely avoid allergens, but you can reduce your symptoms by taking steps to limit your exposure to them. It helps to know exactly what you are allergic to so that you can avoid your specific triggers. SEEK MEDICAL CARE IF:   You have a fever.  You develop a cough that does not stop easily (persistent).  You have shortness of breath.  You start wheezing.  Symptoms interfere with normal daily activities. Document Released: 06/16/2001 Document Revised: 09/26/2013 Document Reviewed: 05/29/2013 [facility] Patient Information 2015 [place]. This information is not intended to replace advice given to you by your health care provider. Make sure you discuss any questions you have with your health care provider.  Costochondritis Ibuprofen and ice local to sore areas Costochondritis, sometimes called Tietze syndrome, is a swelling and irritation (inflammation) of the tissue (cartilage) that connects your ribs with your breastbone (sternum). It causes pain in the chest and rib area. Costochondritis usually goes away on its own over time. It can take up to 6 weeks or longer to get better, especially if you are unable to limit your activities. CAUSES  Some cases of costochondritis have no known cause. Possible causes include:  Injury (trauma).  Exercise or activity such as lifting.  Severe coughing. SIGNS AND SYMPTOMS  Pain and tenderness in the chest and rib area.  Pain that gets worse when coughing or taking deep breaths.  Pain  that gets worse with specific movements. DIAGNOSIS  Your health care provider will do a physical exam and ask about your symptoms. Chest X-rays or other tests may be done to rule out  other problems. TREATMENT  Costochondritis usually goes away on its own over time. Your health care provider may prescribe medicine to help relieve pain. HOME CARE INSTRUCTIONS   Avoid exhausting physical activity. Try not to strain your ribs during normal activity. This would include any activities using chest, abdominal, and side muscles, especially if heavy weights are used.  Apply ice to the affected area for the first 2 days after the pain begins.  Put ice in a plastic bag.  Place a towel between your skin and the bag.  Leave the ice on for 20 minutes, 2-3 times a day.  Only take over-the-counter or prescription medicines as directed by your health care provider. SEEK MEDICAL CARE IF:  You have redness or swelling at the rib joints. These are signs of infection.  Your pain does not go away despite rest or medicine. SEEK IMMEDIATE MEDICAL CARE IF:   Your pain increases or you are very uncomfortable.  You have shortness of breath or difficulty breathing.  You cough up blood.  You have worse chest pains, sweating, or vomiting.  You have a fever or persistent symptoms for more than 2-3 days.  You have a fever and your symptoms suddenly get worse. MAKE SURE YOU:   Understand these instructions.  Will watch your condition.  Will get help right away if you are not doing well or get worse. Document Released: 07/01/2005 Document Revised: 07/12/2013 Document Reviewed: 04/25/2013 [facility] Patient Information 2015 [place], [place]. This information is not intended to replace advice given to you by your health care provider. Make sure you discuss any questions you have with your health care provider.

## 2015-01-20 NOTE — ED Notes (Signed)
C/o pain in left side, comes and goes. Worse w certain movements, causes her to cry out when the pain comes . Allergies bothering her x 1 week, minimal relief w OTC medications.

## 2015-01-28 IMAGING — CR DG CHEST 2V
2 series · 2 of 2 positions shown · non-contrast
Comparison: June 03, 2010.

CLINICAL DATA: Chest pain.

EXAM:
CHEST  2 VIEW

[view not recorded (1 of 2)]
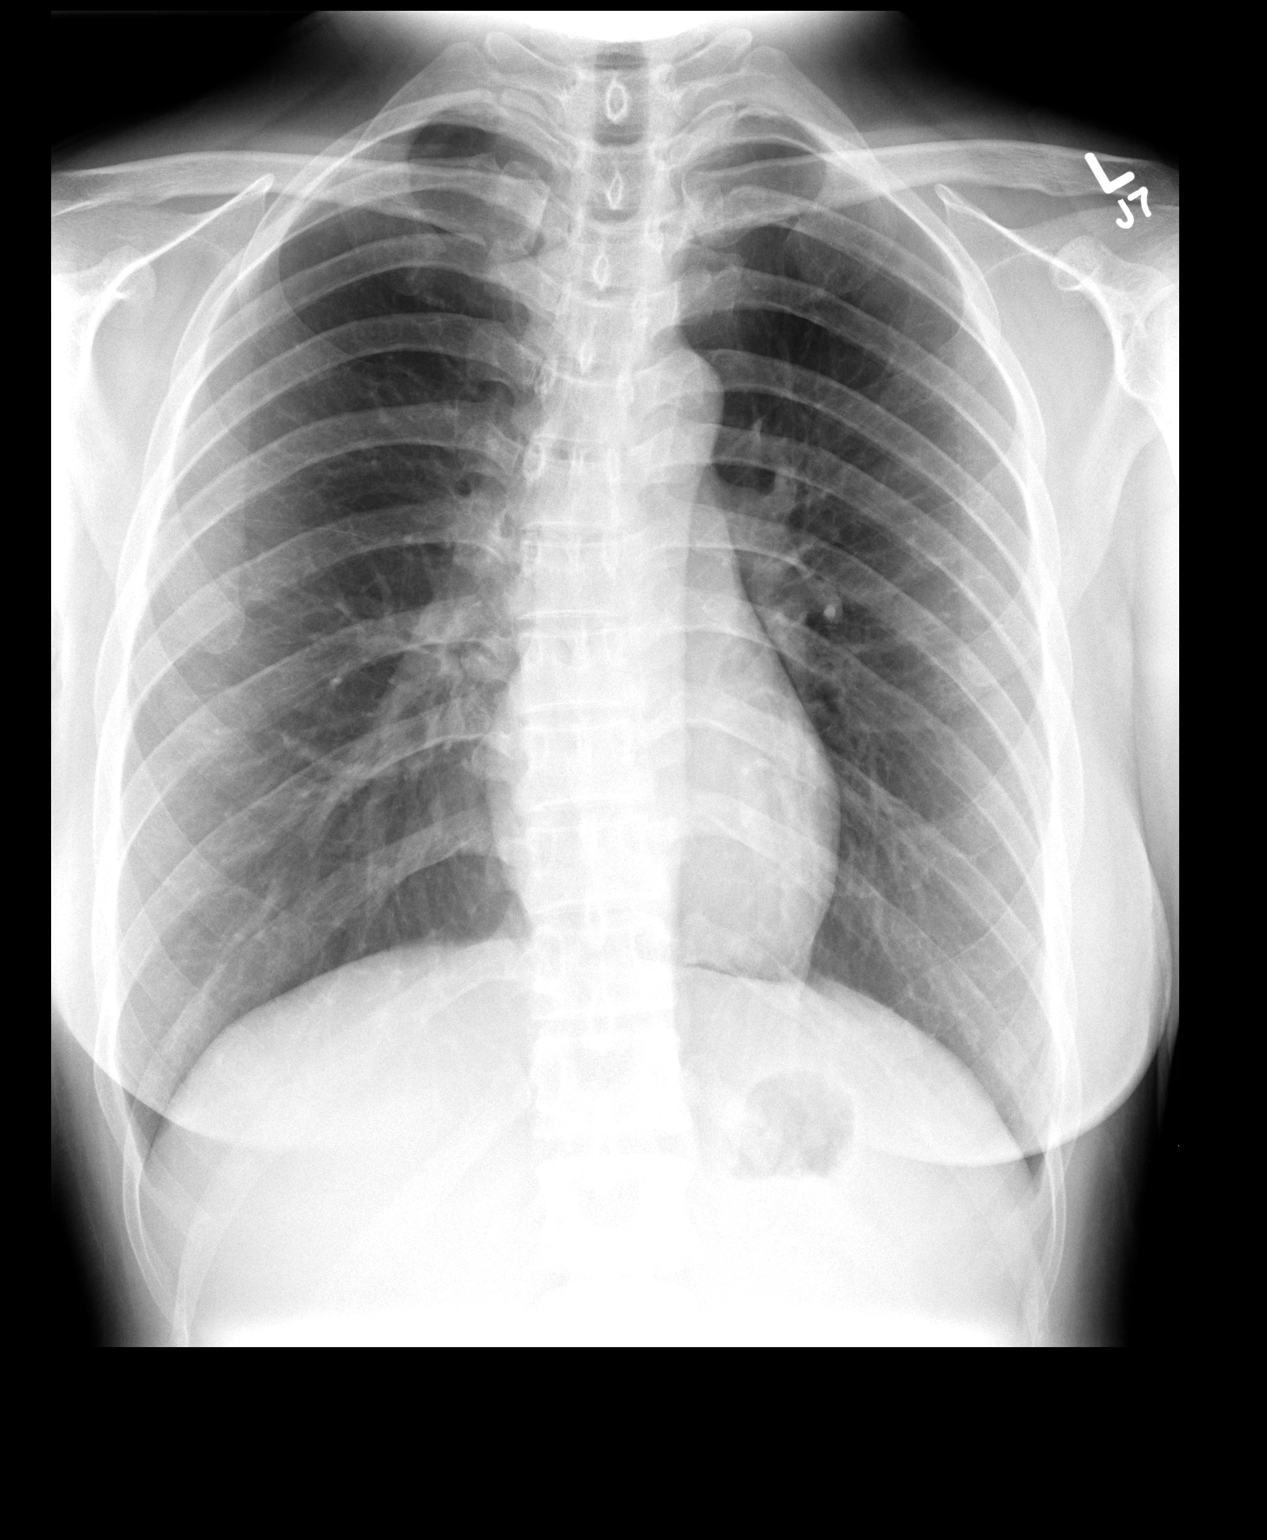

[view not recorded (2 of 2)]
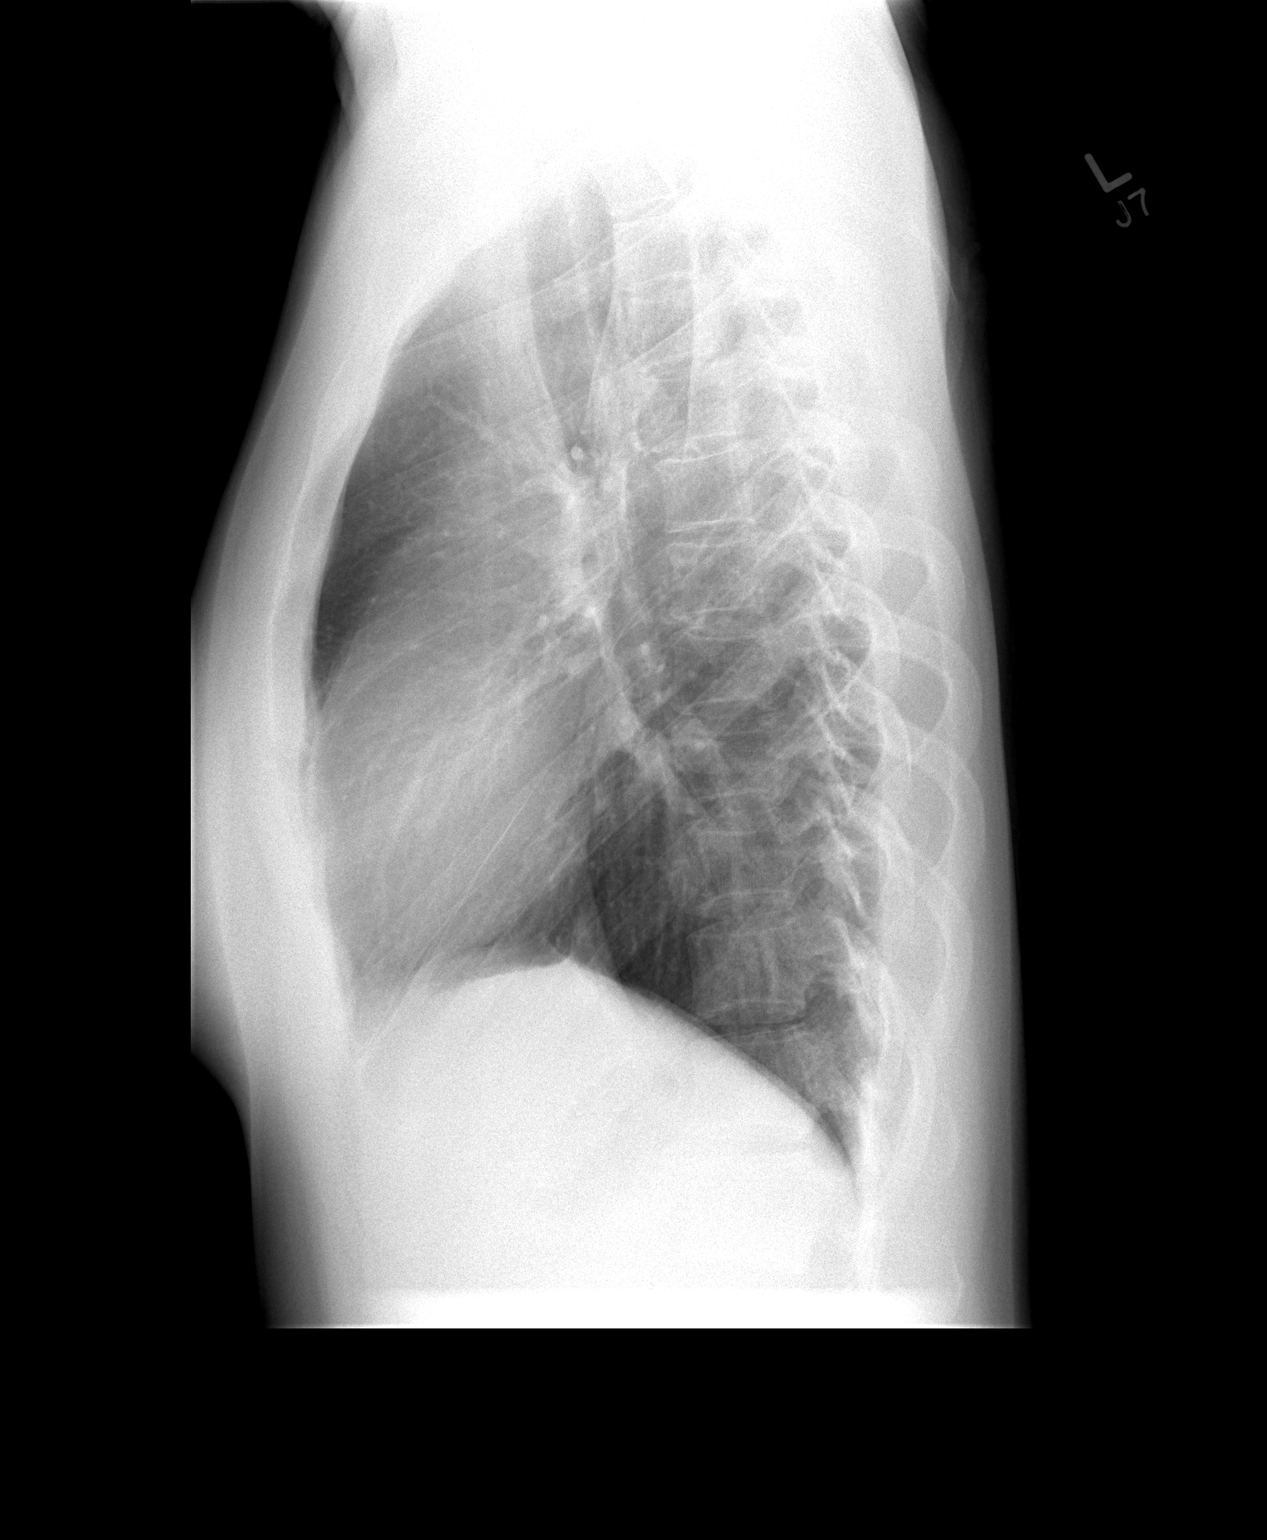

[2 of 2 positions shown; findings below may reference images not displayed]

FINDINGS: The heart size and mediastinal contours are within normal limits.
Both lungs are clear. No pneumothorax or pleural effusion is noted.
The visualized skeletal structures are unremarkable.
IMPRESSION: No acute cardiopulmonary abnormality seen.

## 2015-07-01 ENCOUNTER — Encounter: Payer: Self-pay | Admitting: Gastroenterology

## 2015-07-19 ENCOUNTER — Encounter: Payer: Self-pay | Admitting: Gastroenterology

## 2015-07-25 ENCOUNTER — Ambulatory Visit (INDEPENDENT_AMBULATORY_CARE_PROVIDER_SITE_OTHER): Payer: BLUE CROSS/BLUE SHIELD | Admitting: Gastroenterology

## 2015-07-25 ENCOUNTER — Encounter: Payer: Self-pay | Admitting: Gastroenterology

## 2015-07-25 VITALS — BP 90/60 | Ht 63.0 in | Wt 137.2 lb

## 2015-07-25 DIAGNOSIS — R140 Abdominal distension (gaseous): Secondary | ICD-10-CM | POA: Diagnosis not present

## 2015-07-25 DIAGNOSIS — R630 Anorexia: Secondary | ICD-10-CM | POA: Diagnosis not present

## 2015-07-25 DIAGNOSIS — R110 Nausea: Secondary | ICD-10-CM

## 2015-07-25 DIAGNOSIS — J029 Acute pharyngitis, unspecified: Secondary | ICD-10-CM

## 2015-07-25 DIAGNOSIS — R1013 Epigastric pain: Secondary | ICD-10-CM | POA: Diagnosis not present

## 2015-07-25 MED ORDER — PANTOPRAZOLE SODIUM 20 MG PO TBEC: 20.0000 mg | DELAYED_RELEASE_TABLET | Freq: Every day | ORAL | Status: DC

## 2015-07-25 MED ORDER — LIDOCAINE VISCOUS 2 % MT SOLN: 20.0000 mL | OROMUCOSAL | Status: DC | PRN

## 2015-07-25 NOTE — Progress Notes (Signed)
07/25/2015 Tonya [name] [id number] 1986-09-17   HISTORY OF PRESENT ILLNESS:  This is a 29 year old [place] female who is previously known to Dr. Arlyce DiceKaplan in 2012 for complaints of constipation. Her care will be assumed by Dr. Lavon PaganiniNandigam in his absence. She presents to our office today with complaints of sore throat, nausea, epigastric discomfort, bloating, and anorexia/poor appetite. She tells me that these symptoms have been bothering her for quite some time. She says that she often feels very hungry but then when she tries to eat she has to actually force herself to eat. Then when she does actually eat it does not seem to last long before she is starving again. She is on pantoprazole 20 mg daily, but is not good about taking it consistently. She actually says the constipation is much better.  In regards to the sore throat, she says that this has been going on and off frequently for a while. She also gets fevers and flulike symptoms when that occurs. She says that she gets white spots on the back of her throat/tonsils that have a bad odor. I believe she has seen infectious disease for this in the past as well, but she says that nothing has ever been found.   Past Medical History  Diagnosis Date  . GERD (gastroesophageal reflux disease)   . Migraines     "few times/year" (03/27/2014)  . Headache     "~ weekly" (03/27/2014)  . Hemorrhoids   . Thrombocytopenia (HCC)   . Anemia   . Hypokalemia    Past Surgical History  Procedure Laterality Date  . Dilation and curettage of uterus  2006    reports that she has never smoked. She has never used smokeless tobacco. She reports that she does not use illicit drugs. Her alcohol history is not on file. family history includes Colon cancer in her mother; Gout in her father; Hyperlipidemia in her father; Hypertension in her father; Irritable bowel syndrome in her maternal grandmother and mother; Migraines in her mother; Other in an other family  member; Seizures in her brother. No Known Allergies    Outpatient Encounter Prescriptions as of 07/25/2015  Medication Sig  . pantoprazole (PROTONIX) 20 MG tablet Take 1 tablet (20 mg total) by mouth daily.  . [DISCONTINUED] pantoprazole (PROTONIX) 20 MG tablet Take 20 mg by mouth daily.  Marland Kitchen. lidocaine (XYLOCAINE) 2 % solution Use as directed 20 mLs in the mouth or throat every 4 (four) hours as needed for mouth pain.  . [DISCONTINUED] cetirizine (ZYRTEC) 10 MG tablet Take 10 mg by mouth daily.  . [DISCONTINUED] dexamethasone (DECADRON) 2 MG tablet Take 2 tablets (4 mg total) by mouth 2 (two) times daily with a meal. X 4 days, then 1 tab twice a day x 4 days until complete  . [DISCONTINUED] dextromethorphan-guaiFENesin (MUCINEX DM) 30-600 MG per 12 hr tablet Take 1 tablet by mouth 2 (two) times daily.  . [DISCONTINUED] doxycycline (VIBRAMYCIN) 100 MG capsule Take 1 capsule (100 mg total) by mouth 2 (two) times daily.  . [DISCONTINUED] fexofenadine (ALLEGRA) 180 MG tablet Take 180 mg by mouth daily.  . [DISCONTINUED] ipratropium (ATROVENT) 0.06 % nasal spray Place 2 sprays into both nostrils 4 (four) times daily.  . [DISCONTINUED] omeprazole (PRILOSEC) 40 MG capsule Take 1 capsule (40 mg total) by mouth daily.   No facility-administered encounter medications on file as of 07/25/2015.     REVIEW OF SYSTEMS  : All other systems reviewed and negative except where noted  in the History of Present Illness.   PHYSICAL EXAM: BP 90/60 mmHg  Ht  (1.6 m)  Wt 137 lb 3.2 oz (62.234 kg)  BMI 24.31 kg/m2 General: Well developed Asian female in no acute distress Head: Normocephalic and atraumatic Eyes:  Sclerae anicteric, conjunctiva pink. Ears: Normal auditory acuity Lungs: Clear throughout to auscultation Heart: Regular rate and rhythm Abdomen: Soft, non-distended.  Normal bowel sounds.  Non-tender. Musculoskeletal: Symmetrical with no gross deformities  Skin: No lesions on visible  extremities Extremities: No edema  Neurological: Alert oriented x 4, grossly non-focal Psychological:  Alert and cooperative. Normal mood and affect  ASSESSMENT AND PLAN: -29 year-old [place] female with complaints of ongoing epigastric discomfort, bloating, nausea, sore throat, and anorexia/poor appetite. She is on pantoprazole 20 mg daily. She may have H. pylori related symptoms. Due to the ongoing nature of her symptoms we will schedule her for an EGD to rule out ulcer disease, etc. Hopefully biopsies can be performed to rule out H. pylori neck and be treated if needed. In the interim I will increase her pantoprazole to 40 mg daily. I do not think that her sore throat is reflux related. It actually sounds more like tonsillar-related pain. I discussed with her that she may need to see ENT about that. She is asking for something for the sore throat so we can try some viscous lidocaine to numb the area.  The risks, benefits, and alternatives to EGD were discussed with the patient and she consents to proceed.   CC:  No ref. provider found

## 2015-07-25 NOTE — Patient Instructions (Signed)
We sent prescriptions to [facility] [address] 1. Lidocaine suspension. Swish and swallow. 2. Pantoprazole sodium 40 mg, Take 1 tab daily.   You have been scheduled for an endoscopy. Please follow written instructions given to you at your visit today. If you use inhalers (even only as needed), please bring them with you on the day of your procedure. Your physician has requested that you go to www.startemmi.com and enter the access code given to you at your visit today. This web site gives a general overview about your procedure. However, you should still follow specific instructions given to you by our office regarding your preparation for the procedure.

## 2015-07-31 NOTE — Progress Notes (Signed)
Reviewed and agree with documentation and assessment and plan. K. Veena Nainoa Woldt , MD   

## 2015-10-03 ENCOUNTER — Encounter: Payer: Self-pay | Admitting: Gastroenterology

## 2015-10-03 ENCOUNTER — Ambulatory Visit (AMBULATORY_SURGERY_CENTER): Payer: BLUE CROSS/BLUE SHIELD | Admitting: Gastroenterology

## 2015-10-03 VITALS — BP 98/70 | HR 76 | Temp 96.0°F | Resp 15 | Ht 63.0 in | Wt 137.0 lb

## 2015-10-03 DIAGNOSIS — R1013 Epigastric pain: Secondary | ICD-10-CM

## 2015-10-03 MED ORDER — SODIUM CHLORIDE 0.9 % IV SOLN: 500.0000 mL | INTRAVENOUS | Status: DC

## 2015-10-03 NOTE — Patient Instructions (Signed)
YOU HAD AN ENDOSCOPIC PROCEDURE TODAY AT THE [facility]:   Refer to the procedure report that was given to you for any specific questions about what was found during the examination.  If the procedure report does not answer your questions, please call your gastroenterologist to clarify.  If you requested that your care partner not be given the details of your procedure findings, then the procedure report has been included in a sealed envelope for you to review at your convenience later.  YOU SHOULD EXPECT: Some feelings of bloating in the abdomen. Passage of more gas than usual.  Walking can help get rid of the air that was put into your GI tract during the procedure and reduce the bloating. If you had a lower endoscopy (such as a colonoscopy or flexible sigmoidoscopy) you may notice spotting of blood in your stool or on the toilet paper. If you underwent a bowel prep for your procedure, you may not have a normal bowel movement for a few days.  Please Note:  You might notice some irritation and congestion in your nose or some drainage.  This is from the oxygen used during your procedure.  There is no need for concern and it should clear up in a day or so.  SYMPTOMS TO REPORT IMMEDIATELY:     Following upper endoscopy (EGD)  Vomiting of blood or coffee ground material  New chest pain or pain under the shoulder blades  Painful or persistently difficult swallowing  New shortness of breath  Fever of 100F or higher  Black, tarry-looking stools  For urgent or emergent issues, a gastroenterologist can be reached at any hour by calling [phone].   DIET: Your first meal following the procedure should be a small meal and then it is ok to progress to your normal diet. Heavy or fried foods are harder to digest and may make you feel nauseous or bloated.  Likewise, meals heavy in dairy and vegetables can increase bloating.  Drink plenty of fluids but you should avoid alcoholic beverages  for 24 hours.  ACTIVITY:  You should plan to take it easy for the rest of today and you should NOT DRIVE or use heavy machinery until tomorrow (because of the sedation medicines used during the test).    FOLLOW UP: Our staff will call the number listed on your records the next business day following your procedure to check on you and address any questions or concerns that you may have regarding the information given to you following your procedure. If we do not reach you, we will leave a message.  However, if you are feeling well and you are not experiencing any problems, there is no need to return our call.  We will assume that you have returned to your regular daily activities without incident.  If any biopsies were taken you will be contacted by phone or by letter within the next 1-3 weeks.  Please call us at [phone] if you have not heard about the biopsies in 3 weeks.    SIGNATURES/CONFIDENTIALITY: You and/or your care partner have signed paperwork which will be entered into your electronic medical record.  These signatures attest to the fact that that the information above on your After Visit Summary has been reviewed and is understood.  Full responsibility of the confidentiality of this discharge information lies with you and/or your care-partner.   Handout was given to your care partner on anti-reflux regimen to be followed. You may resume your  current medications today.  Await biopsy results. Please call if any questions or concerns.

## 2015-10-03 NOTE — Progress Notes (Signed)
No problems noted in the recovery room. maw 

## 2015-10-03 NOTE — Progress Notes (Signed)
Patient awakening,vss,report to rn 

## 2015-10-03 NOTE — Progress Notes (Signed)
Called to room to assist during endoscopic procedure.  Patient ID and intended procedure confirmed with present staff. Received instructions for my participation in the procedure from the performing physician.  

## 2015-10-03 NOTE — Op Note (Signed)
[facility] [address] [place], [id number]   ENDOSCOPY PROCEDURE REPORT  Nori Riis: Armbrister, Brigitte  MR#: [id number] BIRTHDATE: 1986-09-12 , 29  yrs. old GENDER: female ENDOSCOPIST: Marsa ArisKavitha Rennie Rouch, MD REFERRED BY:  Bradd CanaryJames Kindl, M.D. PROCEDURE DATE:  10/03/2015 PROCEDURE:  EGD, diagnostic, EGD w/ biopsy , and EGD w/ biopsy for H.pylori ASA CLASS:     Class II INDICATIONS:  dyspepsia, heartburn, nausea, bloatingand epigastric abdominal pain. MEDICATIONS: Propofol 200 mg IV TOPICAL ANESTHETIC: none  DESCRIPTION OF PROCEDURE: After the risks benefits and alternatives of the procedure were thoroughly explained, informed consent was obtained.  The LB [id number] endoscope was introduced through the mouth and advanced to the second portion of the duodenum , Without limitations.  The instrument was slowly withdrawn as the mucosa was fully examined.      EXAM: The esophagus and gastroesophageal junction were completely normal in appearance.  The stomach was entered and closely examined.The antrum, angularis, and lesser curvature were well visualized, including a retroflexed view of the cardia and fundus. The stomach wall was normally distensable.  The scope passed easily through the pylorus into the duodenum.  Retroflexed views revealed no abnormalities.   random biopsies were obtained from duodenum to rule out celiac disease and gastric to rule out H. pylori.  The scope was then withdrawn from the patient and the procedure completed.  COMPLICATIONS: There were no immediate complications.  ENDOSCOPIC IMPRESSION: Normal appearing esophagus and GE junction, the stomach was well visualized and normal in appearance, normal appearing duodenum  RECOMMENDATIONS: 1.  Await biopsy results 2.  Continue PPI 3.  Anti-reflux regimen to be follow    eSigned:  Marsa ArisKavitha Sharlena Kristensen, MD 10/03/2015 9:51 AM

## 2015-10-04 ENCOUNTER — Telehealth: Payer: Self-pay | Admitting: *Deleted

## 2015-10-04 NOTE — Telephone Encounter (Signed)
  Follow up Call-  Call back number 10/03/2015  Post procedure Call Back phone  # [phone]  Permission to leave phone message Yes     Patient questions:  Do you have a fever, pain , or abdominal swelling? No. Pain Score  0 *  Have you tolerated food without any problems? Yes.    Have you been able to return to your normal activities? Yes.    Do you have any questions about your discharge instructions: Diet   No. Medications  No. Follow up visit  No.  Do you have questions or concerns about your Care? No.  Actions: * If pain score is 4 or above: No action needed, pain <4.

## 2015-10-14 ENCOUNTER — Encounter: Payer: Self-pay | Admitting: Gastroenterology

## 2015-10-24 ENCOUNTER — Ambulatory Visit: Payer: Self-pay | Admitting: Allergy and Immunology

## 2016-03-17 ENCOUNTER — Telehealth: Payer: Self-pay | Admitting: Gastroenterology

## 2016-03-17 NOTE — Telephone Encounter (Signed)
Patient was rescheduled to tomorrow at 4:00

## 2016-03-18 ENCOUNTER — Encounter: Payer: Self-pay | Admitting: Gastroenterology

## 2016-03-18 ENCOUNTER — Other Ambulatory Visit (INDEPENDENT_AMBULATORY_CARE_PROVIDER_SITE_OTHER): Payer: BLUE CROSS/BLUE SHIELD

## 2016-03-18 ENCOUNTER — Ambulatory Visit (INDEPENDENT_AMBULATORY_CARE_PROVIDER_SITE_OTHER): Payer: BLUE CROSS/BLUE SHIELD | Admitting: Gastroenterology

## 2016-03-18 VITALS — BP 84/60 | HR 88 | Ht 63.75 in | Wt 136.4 lb

## 2016-03-18 DIAGNOSIS — D696 Thrombocytopenia, unspecified: Secondary | ICD-10-CM

## 2016-03-18 DIAGNOSIS — R630 Anorexia: Secondary | ICD-10-CM

## 2016-03-18 DIAGNOSIS — R5383 Other fatigue: Secondary | ICD-10-CM | POA: Diagnosis not present

## 2016-03-18 LAB — CBC WITH DIFFERENTIAL/PLATELET
BASOS ABS: 0 10*3/uL (ref 0.0–0.1)
Basophils Relative: 0.9 % (ref 0.0–3.0)
Eosinophils Absolute: 0.1 10*3/uL (ref 0.0–0.7)
Eosinophils Relative: 1.5 % (ref 0.0–5.0)
HEMATOCRIT: 41 % (ref 36.0–46.0)
Hemoglobin: 13.3 g/dL (ref 12.0–15.0)
LYMPHS ABS: 2 10*3/uL (ref 0.7–4.0)
Lymphocytes Relative: 42.4 % (ref 12.0–46.0)
MCHC: 32.4 g/dL (ref 30.0–36.0)
MCV: 78.3 fl (ref 78.0–100.0)
MONO ABS: 0.9 10*3/uL (ref 0.1–1.0)
NEUTROS ABS: 1.7 10*3/uL (ref 1.4–7.7)
Neutrophils Relative %: 35.9 % — ABNORMAL LOW (ref 43.0–77.0)
RBC: 5.23 Mil/uL — AB (ref 3.87–5.11)
RDW: 13.9 % (ref 11.5–15.5)
WBC: 4.8 10*3/uL (ref 4.0–10.5)

## 2016-03-18 LAB — COMPREHENSIVE METABOLIC PANEL
ALT: 15 U/L (ref 0–35)
AST: 17 U/L (ref 0–37)
Albumin: 4 g/dL (ref 3.5–5.2)
Alkaline Phosphatase: 52 U/L (ref 39–117)
BILIRUBIN TOTAL: 0.3 mg/dL (ref 0.2–1.2)
BUN: 9 mg/dL (ref 6–23)
CALCIUM: 9.3 mg/dL (ref 8.4–10.5)
CO2: 29 meq/L (ref 19–32)
CREATININE: 0.78 mg/dL (ref 0.40–1.20)
Chloride: 103 mEq/L (ref 96–112)
GFR: 91.47 mL/min (ref >60.00)
GLUCOSE: 118 mg/dL — AB (ref 70–99)
Potassium: 4.3 mEq/L (ref 3.5–5.1)
SODIUM: 137 meq/L (ref 135–145)
Total Protein: 7.7 g/dL (ref 6.0–8.3)

## 2016-03-18 LAB — FOLATE: Folate: 16.1 ng/mL (ref >5.9)

## 2016-03-18 LAB — VITAMIN B12: Vitamin B-12: 921 pg/mL — ABNORMAL HIGH (ref 211–911)

## 2016-03-18 NOTE — Patient Instructions (Signed)
Go to the basement for labs today  [facility] on 03/24/2016 at 8:15am. Please arrive 15 minutes prior to your appointment for registration. Make certain not to have anything to eat or drink after midnight  prior to your appointment. Should you need to reschedule your appointment, please contact radiology at [phone]. This test typically takes about 30 minutes to perform.

## 2016-03-18 NOTE — Progress Notes (Signed)
Tonya [name]    [id number]    10-Mar-1986  Primary Care Physician:No primary care provider on file.  Referring Physician: No referring provider defined for this encounter.  Chief complaint:  Fatigue, lack of appetite  HPI: 30 year old female here for follow-up visit with complaints of persistent fatigue and lack of appetite. Her symptoms started about 2 years ago when she had fever for 2 weeks associated with pain in her throat and also white plaques with follow-up smelling breath, she was noted to have leukopenia and thrombocytopenia at the time. She was treated with doxycycline for possible Lyme disease and felt a little better after. The patient has been having persistent fatigue and loss of appetite since then. She had 1 more episode of fever 2 weeks ago with similar symptoms which started with pain in her throat. Was evaluated by her PMD, and was noted to have thrombocytopenia with platelet count of 114. Rest of the labs were within normal limits. EGD was unremarkable in December 2016. Her weight has been stable. Denies any nausea, vomiting, abdominal pain, constipation, diarrhea, abdominal fullness, melena or bright red blood per rectum    Outpatient Encounter Prescriptions as of 03/18/2016  Medication Sig  . fexofenadine (ALLEGRA) 180 MG tablet Take 180 mg by mouth daily.  . GuaiFENesin (MUCINEX PO) Take by mouth as needed.  . Ibuprofen (ADVIL) 200 MG CAPS Take by mouth as needed.  Marland Kitchen. Phenylephrine-Pheniramine-DM (THERAFLU COLD & COUGH PO) Take 1 tablet by mouth as needed.  . [DISCONTINUED] lidocaine (XYLOCAINE) 2 % solution Use as directed 20 mLs in the mouth or throat every 4 (four) hours as needed for mouth pain. (Patient not taking: Reported on 10/03/2015)  . [DISCONTINUED] pantoprazole (PROTONIX) 20 MG tablet Take 1 tablet (20 mg total) by mouth daily. (Patient not taking: Reported on 10/03/2015)   No facility-administered encounter medications on file as of 03/18/2016.      Allergies as of 03/18/2016  . (No Known Allergies)    Past Medical History  Diagnosis Date  . GERD (gastroesophageal reflux disease)   . Migraines     "few times/year" (03/27/2014)  . Headache     "~ weekly" (03/27/2014)  . Hemorrhoids   . Thrombocytopenia (HCC)   . Anemia   . Hypokalemia     Past Surgical History  Procedure Laterality Date  . Dilation and curettage of uterus  2006    Family History  Problem Relation Age of Onset  . Hypertension Father   . Hyperlipidemia Father   . Gout Father   . Colon cancer Mother     Dx at 6046  . Migraines Mother   . Seizures Brother   . Irritable bowel syndrome Maternal Grandmother   . Irritable bowel syndrome Mother   . Other      Patient does not know if her family has had heart trouble due to most of family being in [place] with less access to healthcare    Social History   Social History  . Marital Status: Married    Spouse Name: N/A  . Number of Children: 1  . Years of Education: N/A   Occupational History  . manicurist    Social History Main Topics  . Smoking status: Never Smoker   . Smokeless tobacco: Never Used  . Alcohol Use: Not on file  . Drug Use: No  . Sexual Activity: Yes    Birth Control/ Protection: None   Other Topics Concern  .  Not on file   Social History Narrative   She moved to the [place] from [place] around age 1.      Review of systems: Review of Systems  Constitutional: Negative for fever and chills.  HENT: Negative.   Eyes: Negative for blurred vision.  Respiratory: Negative for cough, shortness of breath and wheezing.   Cardiovascular: Negative for chest pain and palpitations.  Gastrointestinal: as per HPI Genitourinary: Negative for dysuria, urgency, frequency and hematuria.  Musculoskeletal: Negative for myalgias, back pain and joint pain.  Skin: Negative for itching and rash.  Neurological: Negative for dizziness, tremors, focal weakness, seizures and loss of consciousness.   Endo/Heme/Allergies: Negative for environmental allergies.  Psychiatric/Behavioral: Negative for depression, suicidal ideas and hallucinations.  All other systems reviewed and are negative.   Physical Exam: Filed Vitals:   03/18/16 1551  BP: 84/60  Pulse: 88   Gen:      No acute distress HEENT:  EOMI, sclera anicteric Neck:     No masses; no thyromegaly Lungs:    Clear to auscultation bilaterally; normal respiratory effort CV:         Regular rate and rhythm; no murmurs Abd:      + bowel sounds; soft, non-tender; no palpable masses, no distension Ext:    No edema; adequate peripheral perfusion Skin:      Warm and dry; no rash Neuro: alert and oriented x 3 Psych: Depressed mood and flat affect  Data Reviewed:  Reviewed chart in epic HIV negative Lyme & [place] spotted fever negative ANA negative Mono screen was negative but EBV antibody IgG and IgM level was elevated and CMV antibody IgG was elevated  Assessment and Plan/Recommendations:  30 year old female with complaints of chronic fatigue and lack of appetite since 2015, so far based on her workup she has thrombocytopenia  She had thrombocytopenia along with leukopenia with increase increase titer for EBV and CMV back in 2015 suggestive of possible mononucleosis syndrome at the time. Chronic fatigue could be related to chronic EBV infection We will check abdominal ultrasound to evaluate for possible splenomegaly given her thrombocytopenia We'll also check a PCR for EBV and CMV Based on results will consider referral to ID/hematology for further evaluation of thrombocytopenia Return in 2-3 months   K. Scherry Ran , MD [phone] Mon-Fri 8a-5p [phone] after 5p, weekends, holidays  CC: No ref. provider found

## 2016-03-19 LAB — TISSUE TRANSGLUTAMINASE, IGA: Tissue Transglutaminase Ab, IgA: 1 U/mL (ref <4)

## 2016-03-24 ENCOUNTER — Ambulatory Visit (HOSPITAL_COMMUNITY): Payer: BLUE CROSS/BLUE SHIELD

## 2016-03-24 LAB — CMV DNA, QUANTITATIVE, PCR: CMV DNA QUANT: NEGATIVE [IU]/mL

## 2016-03-24 LAB — EPSTEIN BARR VRS(EBV DNA BY PCR): EPSTEIN-BARR DNA QUANT, PCR: NEGATIVE {copies}/mL

## 2016-03-26 ENCOUNTER — Other Ambulatory Visit: Payer: Self-pay

## 2016-03-26 ENCOUNTER — Ambulatory Visit (HOSPITAL_COMMUNITY)
Admission: RE | Admit: 2016-03-26 | Discharge: 2016-03-26 | Disposition: A | Discharge status: Home / Self Care | Payer: BLUE CROSS/BLUE SHIELD | Source: Ambulatory Visit | Attending: Gastroenterology | Admitting: Gastroenterology

## 2016-03-26 DIAGNOSIS — R630 Anorexia: Secondary | ICD-10-CM | POA: Diagnosis not present

## 2016-03-26 DIAGNOSIS — D696 Thrombocytopenia, unspecified: Secondary | ICD-10-CM | POA: Diagnosis not present

## 2016-03-26 DIAGNOSIS — R109 Unspecified abdominal pain: Secondary | ICD-10-CM

## 2016-03-26 DIAGNOSIS — R5383 Other fatigue: Secondary | ICD-10-CM

## 2016-04-16 ENCOUNTER — Ambulatory Visit (HOSPITAL_BASED_OUTPATIENT_CLINIC_OR_DEPARTMENT_OTHER): Payer: BLUE CROSS/BLUE SHIELD | Admitting: Oncology

## 2016-04-16 ENCOUNTER — Telehealth: Payer: Self-pay | Admitting: Oncology

## 2016-04-16 VITALS — BP 109/62 | HR 87 | Temp 97.6°F | Resp 18 | Ht 63.75 in | Wt 141.6 lb

## 2016-04-16 DIAGNOSIS — J312 Chronic pharyngitis: Secondary | ICD-10-CM | POA: Diagnosis not present

## 2016-04-16 DIAGNOSIS — D696 Thrombocytopenia, unspecified: Secondary | ICD-10-CM | POA: Diagnosis not present

## 2016-04-16 NOTE — Consult Note (Signed)
Reason for Referral: Thrombocytopenia.   HPI: 30 year old woman native of [place] currently living in [place]. She is a pleasant woman with history of GERD, migraines as well as recurrent tonsillitis and pharyngitis. She was hospitalized back in 2015 with febrile illness and her workup was on revealing. At that time her platelet count was 92 and otherwise normal CBC. In June 2015 her platelet count was as low as 77 and rebounded to normal range in August 2015. Most recently, on 03/18/2016 her CBC showed a platelet count of 120 and normal white cell count of 4.8 and hemoglobin of 13. She did have mild neutropenia will play a 75% and slight Monocytosis. She Has Been Complaining of Symptoms of sore throat and low-grade fever. She'll also had vague symptoms of nausea and abdominal pain which have resolved at this time. She did have viral serology which was all within normal range. Ultrasound of the abdomen did not show any splenomegaly or lymphadenopathy. Clinically at this point she is asymptomatic. She reports no fevers or chills. Appetite is reasonable and her weight is up 4 pounds since June 2017. She denied any bleeding such as epistaxis or hematochezia. She does endorse a regular menstrual cycles.  She denies any headaches blurry vision syncope or seizures. She does not report any fevers or chills or sweats. She does not report any cough or hemoptysis or hematemesis. She does not report any chest pain, palpitation, leg edema or orthopnea. She does not report any nausea, vomiting, abdominal pain hematochezia or melena. She does not report any frequency, urgency or hematuria. She does not report any skeletal complaints. Remaining review of systems unremarkable.   Past Medical History  Diagnosis Date  . GERD (gastroesophageal reflux disease)   . Migraines     "few times/year" (03/27/2014)  . Headache     "~ weekly" (03/27/2014)  . Hemorrhoids   . Thrombocytopenia (HCC)   . Anemia   . Hypokalemia    :  Past Surgical History  Procedure Laterality Date  . Dilation and curettage of uterus  2006  :   Current outpatient prescriptions:  .  fexofenadine (ALLEGRA) 180 MG tablet, Take 180 mg by mouth daily., Disp: , Rfl:  .  Ibuprofen (ADVIL) 200 MG CAPS, Take by mouth as needed. Reported on 04/16/2016, Disp: , Rfl:  .  Phenylephrine-Pheniramine-DM (THERAFLU COLD & COUGH PO), Take 1 tablet by mouth as needed. Reported on 04/16/2016, Disp: , Rfl: :  No Known Allergies:  Family History  Problem Relation Age of Onset  . Hypertension Father   . Hyperlipidemia Father   . Gout Father   . Colon cancer Mother     Dx at 68  . Migraines Mother   . Seizures Brother   . Irritable bowel syndrome Maternal Grandmother   . Irritable bowel syndrome Mother   . Other      Patient does not know if her family has had heart trouble due to most of family being in [place] with less access to healthcare  :  Social History   Social History  . Marital Status: Married    Spouse Name: N/A  . Number of Children: 1  . Years of Education: N/A   Occupational History  . manicurist    Social History Main Topics  . Smoking status: Never Smoker   . Smokeless tobacco: Never Used  . Alcohol Use: Not on file  . Drug Use: No  . Sexual Activity: Yes    Birth Control/ Protection: None  Other Topics Concern  . Not on file   Social History Narrative   She moved [place] the [place] from [place] around age 89.  :  Pertinent items are noted in HPI.  Exam: Blood pressure 109/62, pulse 87, temperature 97.6 F (36.4 C), temperature source Oral, resp. rate 18, height 5' 3.75" (1.619 m), weight 141 lb 9.6 oz (64.229 kg), last menstrual period 02/26/2016, SpO2 100 %. General appearance: alert and cooperative Appeared without distress. Head: Normocephalic, without obvious abnormality Throat: No oral ulcers or lesions. No evidence of pharyngitis or tonsillitis. Neck: no adenopathy Back: negative Resp: clear to  auscultation bilaterally Chest wall: no tenderness Cardio: regular rate and rhythm, S1, S2 normal, no murmur, click, rub or gallop GI: soft, non-tender; bowel sounds normal; no masses,  no splenomegaly noted. Extremities: extremities normal, atraumatic, no cyanosis or edema Pulses: 2+ and symmetric  CBC    Component Value Date/Time   WBC 4.8 03/18/2016 1654   RBC 5.23* 03/18/2016 1654   HGB 13.3 03/18/2016 1654   HCT 41.0 03/18/2016 1654   PLT 120.0 Large Platelets seen on smear.* 03/18/2016 1654   MCV 78.3 03/18/2016 1654   MCH 25.9* 05/09/2014 1158   MCHC 32.4 03/18/2016 1654   RDW 13.9 03/18/2016 1654   LYMPHSABS 2.0 03/18/2016 1654   MONOABS 0.9 03/18/2016 1654   EOSABS 0.1 03/18/2016 1654   BASOSABS 0.0 03/18/2016 1654      [place] Abdomen Complete  03/26/2016  CLINICAL DATA:  Thrombocytopenia, decreased appetite, nausea EXAM: ABDOMEN ULTRASOUND COMPLETE COMPARISON:  None. FINDINGS: Gallbladder: Gallbladder is visualized and no gallstones are noted. The gallbladder wall is not thickened. Common bile duct: Diameter: The common bile duct is normal measuring 3.2 mm in diameter. Liver: The liver has a normal echogenic pattern. No focal hepatic abnormality is seen. IVC: No abnormality visualized. Pancreas: Only the tail the pancreas is obscured by bowel gas. Spleen: The spleen is normal measuring 5.5 cm. Right Kidney: Length: 11.3 cm.  No hydronephrosis is seen. Left Kidney: Length: 11.8 cm.  No hydronephrosis is noted. Abdominal aorta: The abdominal aorta is normal in caliber. Other findings: None. IMPRESSION: Negative abdominal ultrasound. No gallstones. No ductal dilatation. Electronically Signed   By: Dwyane DeePaul  Barry M.D.   On: 03/26/2016 13:52    Assessment and Plan:   30 year old woman with the following issues:  1. Thrombocytopenia: Her platelet counts have been fluctuating between 72,000 and normal range. Her most recent CBC on 03/18/2016 showed platelet count that are mildly low  other 120.  The differential diagnosis was discussed today and her thrombocytopenia is related to reactive process at this time. Recurrent infections whether his viral or bacterial likely is influencing her platelet count. Her peripheral smear did show enlarged platelets which indicate bone marrow turnover that is rather healthy.  Other etiologies to be considered include ITP, lymphoproliferative disorder and medication. At this time, these possibilities are less likely but warrants observation and surveillance. Ultrasound of the abdomen ruled out splenomegaly and sequestration.  From a management standpoint, she has no active bleeding and platelet count is more than adequate. I recommended repeating a CBC and a peripheral smear in 3-4 months to follow-up on her progress.  2. Recurrent pharyngitis and tonsillitis: She has been evaluated by ENT and tonsillectomy have been recommended. I have no objections to undergoing this procedure and her platelet count is adequate. Decreasing the frequency of her infection will certainly help her counts at this time.  3. Follow-up: Will be in the next  few months to repeat his CBC.

## 2016-04-16 NOTE — Progress Notes (Signed)
Please see consult note.  

## 2016-04-16 NOTE — Telephone Encounter (Signed)
per pof to sch pt appt-gave pt copy of avs °

## 2016-05-21 ENCOUNTER — Ambulatory Visit: Payer: BLUE CROSS/BLUE SHIELD | Admitting: Gastroenterology

## 2016-08-17 ENCOUNTER — Telehealth: Payer: Self-pay | Admitting: Oncology

## 2016-08-17 NOTE — Telephone Encounter (Signed)
Appointments canceled per patient request. Patient does not wish to reschedule at this time.

## 2016-08-19 ENCOUNTER — Ambulatory Visit: Payer: BLUE CROSS/BLUE SHIELD | Admitting: Oncology

## 2016-08-19 ENCOUNTER — Other Ambulatory Visit: Payer: BLUE CROSS/BLUE SHIELD

## 2017-06-09 IMAGING — US US ABDOMEN COMPLETE
1 series · 14 of 25 positions shown · non-contrast
Comparison: None.

CLINICAL DATA: Thrombocytopenia, decreased appetite, nausea

EXAM:
ABDOMEN ULTRASOUND COMPLETE

[Series 1: us abdomen complete · 0.19mm/px · 14 of 99 slices shown]
[im 1/99]
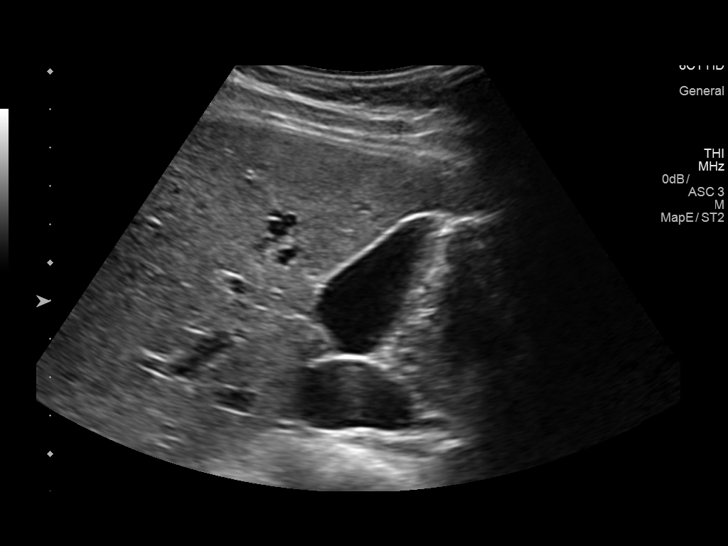
[im 9/99]
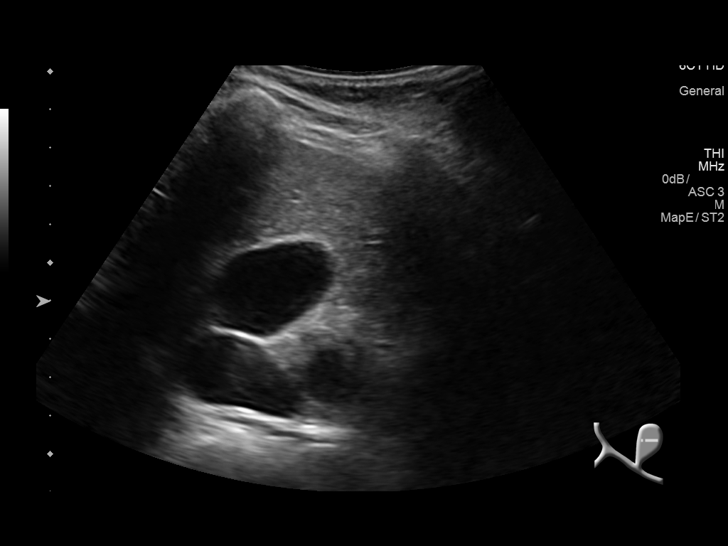
[im 17/99]
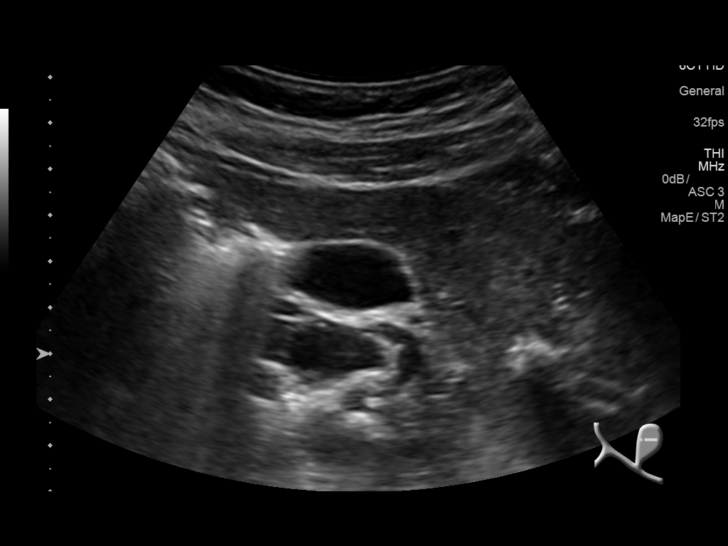
[im 25/99]
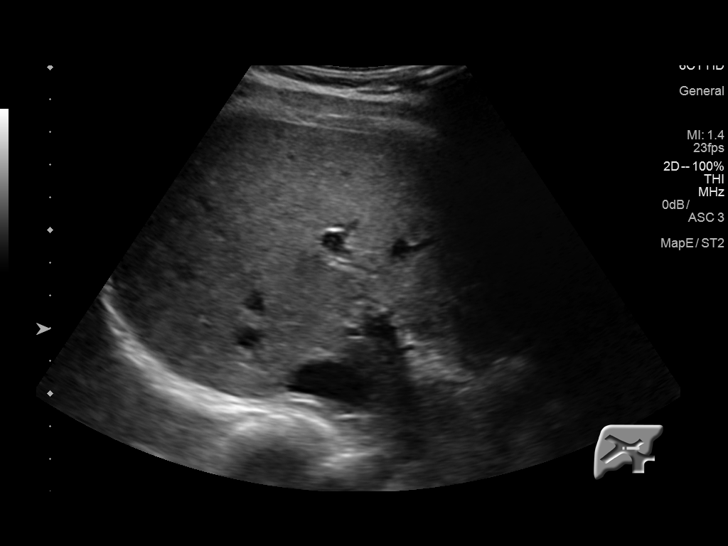
[im 33/99]
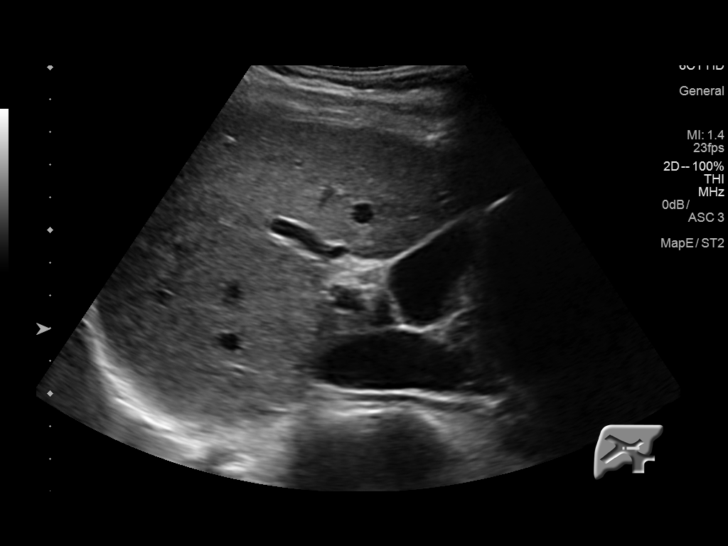
[im 37/99]
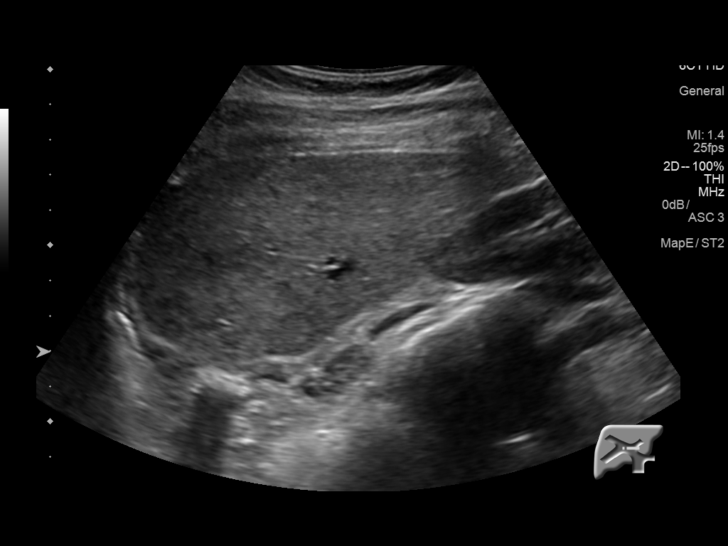
[im 45/99]
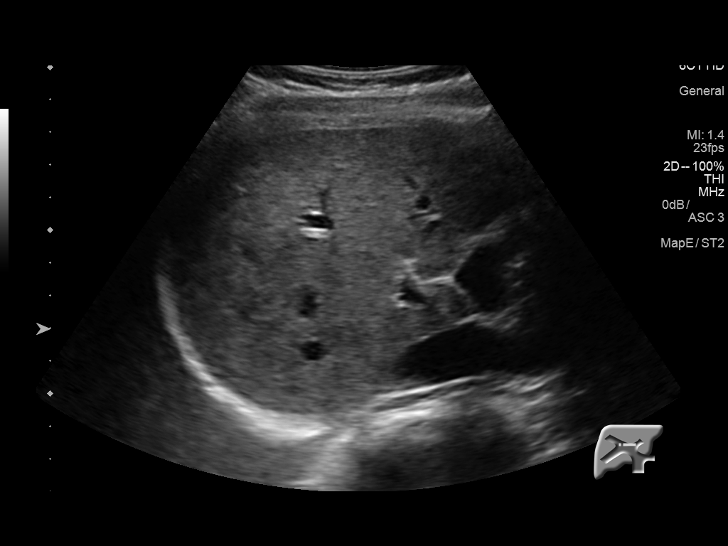
[im 54/99]
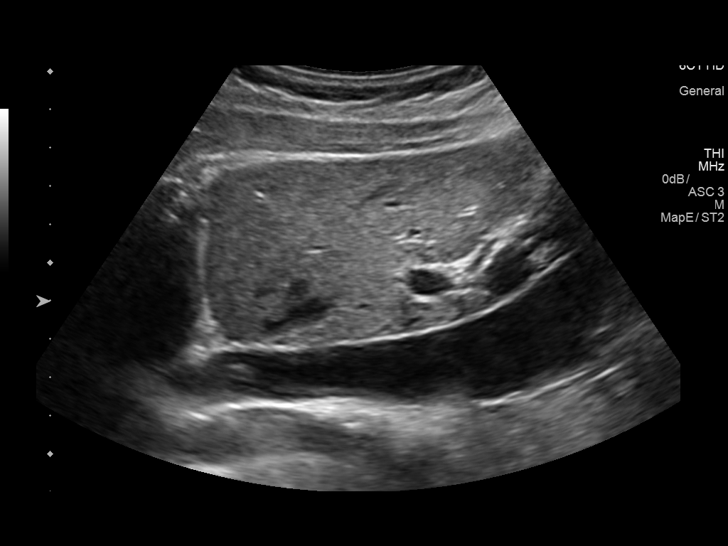
[im 62/99]
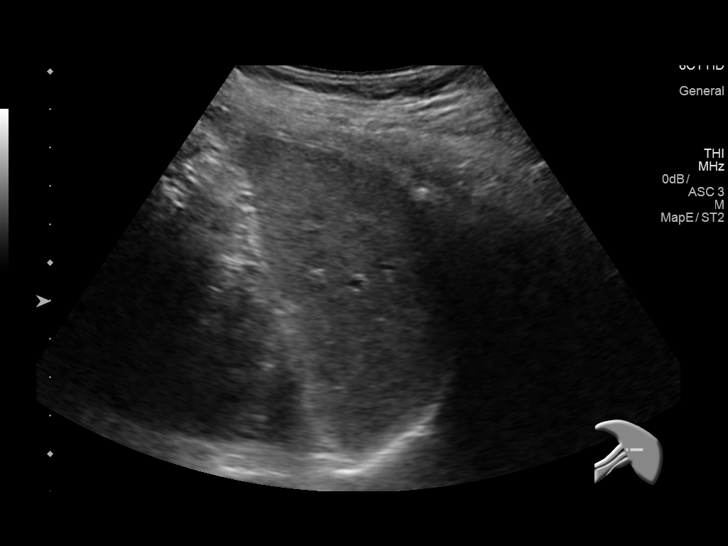
[im 66/99]
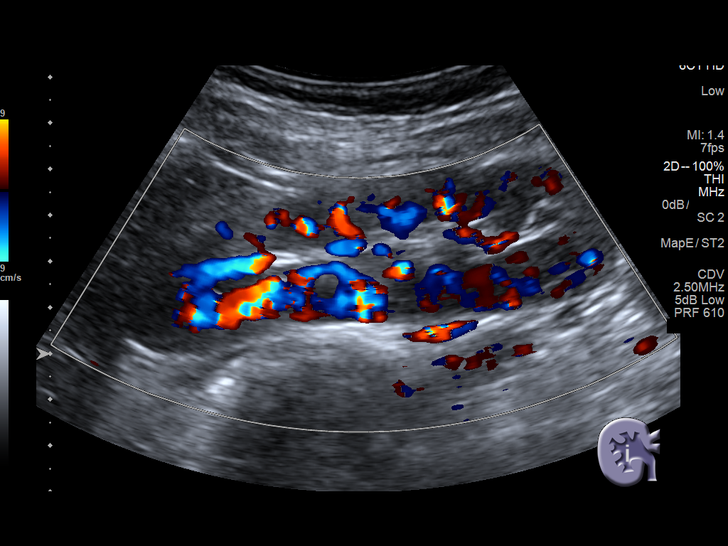
[im 74/99]
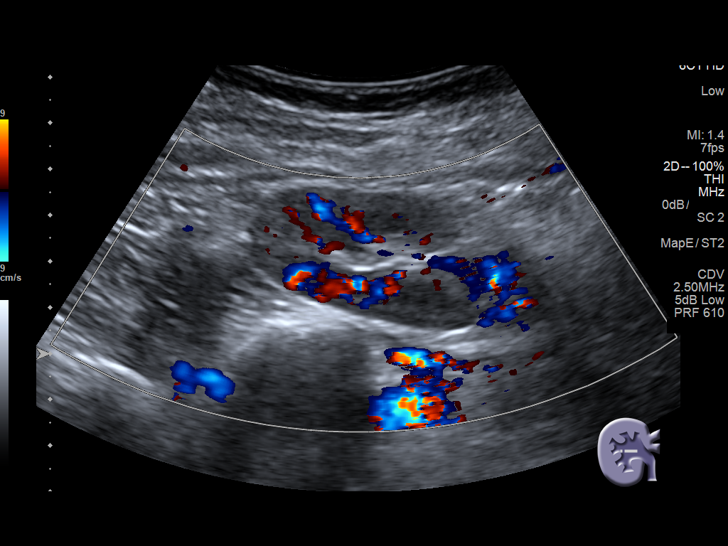
[im 82/99]
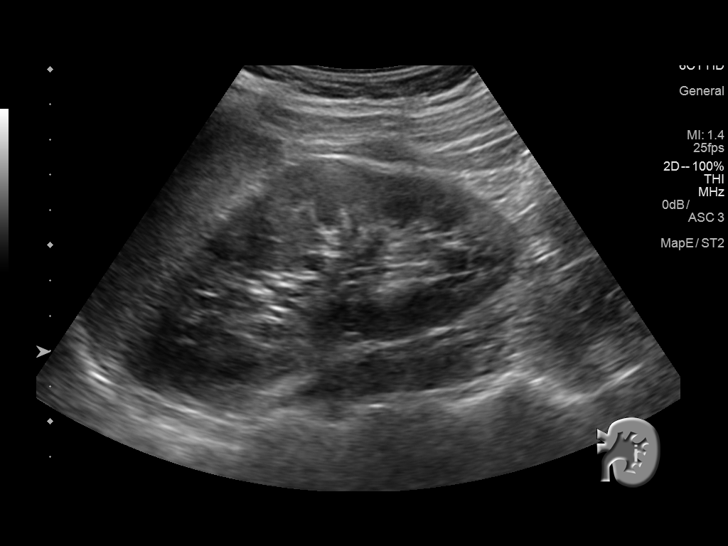
[im 90/99]
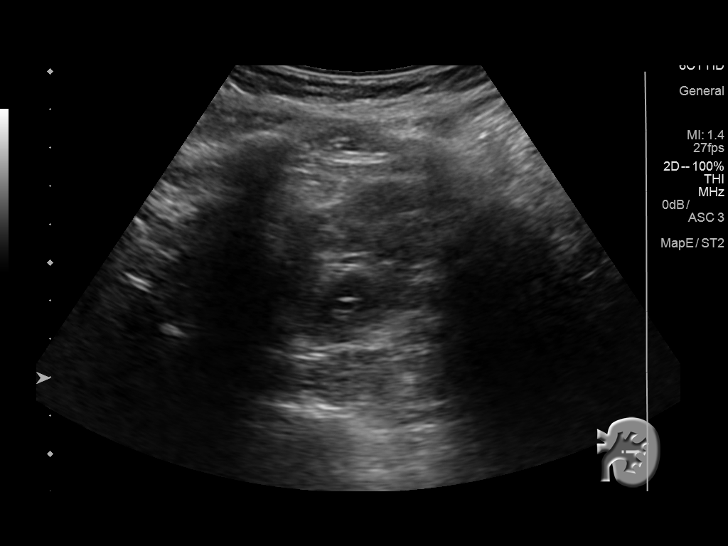
[im 99/99]
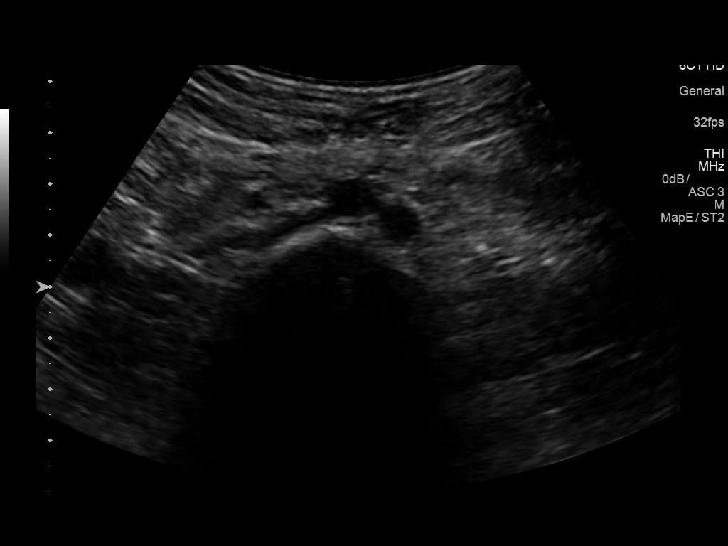

[14 of 25 positions shown; findings below may reference images not displayed]

FINDINGS: Gallbladder: Gallbladder is visualized and no gallstones are noted.
The gallbladder wall is not thickened.

Common bile duct: Diameter: The common bile duct is normal measuring
3.2 mm in diameter.

Liver: The liver has a normal echogenic pattern. No focal hepatic
abnormality is seen.

IVC: No abnormality visualized.

Pancreas: Only the tail the pancreas is obscured by bowel gas.

Spleen: The spleen is normal measuring 5.5 cm.

Right Kidney: Length: 11.3 cm..  No hydronephrosis is seen.

Left Kidney: Length: 11.8 cm..  No hydronephrosis is noted.

Abdominal aorta: The abdominal aorta is normal in caliber.

Other findings: None.
IMPRESSION: Negative abdominal ultrasound. No gallstones. No ductal dilatation.

## 2017-11-01 ENCOUNTER — Encounter (HOSPITAL_COMMUNITY): Payer: Self-pay | Admitting: Family Medicine

## 2017-11-01 ENCOUNTER — Ambulatory Visit (HOSPITAL_COMMUNITY)
Admission: EM | Admit: 2017-11-01 | Discharge: 2017-11-01 | Disposition: A | Discharge status: Home / Self Care | Payer: BLUE CROSS/BLUE SHIELD | Attending: Family Medicine | Admitting: Family Medicine

## 2017-11-01 ENCOUNTER — Other Ambulatory Visit: Payer: Self-pay

## 2017-11-01 DIAGNOSIS — J40 Bronchitis, not specified as acute or chronic: Secondary | ICD-10-CM

## 2017-11-01 MED ORDER — AMOXICILLIN-POT CLAVULANATE 875-125 MG PO TABS: 1.0000 | ORAL_TABLET | Freq: Two times a day (BID) | ORAL | 0 refills | Status: DC

## 2017-11-01 MED ORDER — AZITHROMYCIN 250 MG PO TABS: 250.0000 mg | ORAL_TABLET | Freq: Every day | ORAL | 0 refills | Status: DC

## 2017-11-01 MED ORDER — IBUPROFEN 800 MG PO TABS: 800.0000 mg | ORAL_TABLET | Freq: Three times a day (TID) | ORAL | 0 refills | Status: DC

## 2017-11-01 MED ORDER — HYDROCOD POLST-CPM POLST ER 10-8 MG/5ML PO SUER: 5.0000 mL | Freq: Two times a day (BID) | ORAL | 0 refills | Status: DC | PRN

## 2017-11-01 MED ORDER — AMOXICILLIN 875 MG PO TABS: 875.0000 mg | ORAL_TABLET | Freq: Two times a day (BID) | ORAL | 0 refills | Status: DC

## 2017-11-01 NOTE — ED Provider Notes (Addendum)
[facility]   [id number] 11/01/17 Arrival Time: 5397   SUBJECTIVE:  Tonya [name] is a 32 y.o. female who presents to the urgent care with complaint of cough and congestion.  Has been coughing for 2 weeks.  She has no history of asthma.  She has no recent fever.  She says her left ear is somewhat full.  Since Saturday she has had a headache in the frontal part of her face which is been fairly constant.   Past Medical History:  Diagnosis Date  . Anemia   . GERD (gastroesophageal reflux disease)   . Headache    "~ weekly" (03/27/2014)  . Hemorrhoids   . Hypokalemia   . Migraines    "few times/year" (03/27/2014)  . Thrombocytopenia ([facility])    Family History  Problem Relation Age of Onset  . Hypertension Father   . Hyperlipidemia Father   . Gout Father   . Colon cancer Mother        Dx at 86  . Migraines Mother   . Seizures Brother   . Irritable bowel syndrome Maternal Grandmother   . Irritable bowel syndrome Mother   . Other Unknown        Patient does not know if her family has had heart trouble due to most of family being in [place] with less access to healthcare   Social History   Socioeconomic History  . Marital status: Married    Spouse name: Not on file  . Number of children: 1  . Years of education: Not on file  . Highest education level: Not on file  Social Needs  . Financial resource strain: Not on file  . Food insecurity - worry: Not on file  . Food insecurity - inability: Not on file  . Transportation needs - medical: Not on file  . Transportation needs - non-medical: Not on file  Occupational History  . Occupation: Scientist, clinical (histocompatibility and immunogenetics): [employer]  Tobacco Use  . Smoking status: Never Smoker  . Smokeless tobacco: Never Used  Substance and Sexual Activity  . Alcohol use: Not on file  . Drug use: No  . Sexual activity: Yes    Birth control/protection: None  Other Topics Concern  . Not on file  Social History Narrative   She moved to  the [place] from [place] around age 2.   Current Meds  Medication Sig  . fexofenadine (ALLEGRA) 180 MG tablet Take 180 mg by mouth daily.  . [DISCONTINUED] Ibuprofen (ADVIL) 200 MG CAPS Take by mouth as needed. Reported on 04/16/2016   No Known Allergies    ROS: As per HPI, remainder of ROS negative.   OBJECTIVE:   Vitals:   11/01/17 1926  BP: 121/80  Pulse: 73  Temp: 98.2 F (36.8 C)  TempSrc: Oral  SpO2: 99%     General appearance: alert; no distress Eyes: PERRL; EOMI; conjunctiva normal HENT: normocephalic; atraumatic; TMs normal, canal normal, external ears normal without trauma; nasal mucosa normal; oral mucosa normal Neck: supple Lungs: clear to auscultation bilaterally; coughing throughout the exam Heart: regular rate and rhythm Back: no CVA tenderness Extremities: no cyanosis or edema; symmetrical with no gross deformities Skin: warm and dry Neurologic: normal gait; grossly normal Psychological: alert but not very cooperative; normal mood and affect      Labs:  Results for orders placed or performed in visit on 03/18/16  CBC with Differential/Platelet  Result Value Ref Range   WBC 4.8 4.0 - 10.5 K/uL  RBC 5.23 (H) 3.87 - 5.11 Mil/uL   Hemoglobin 13.3 12.0 - 15.0 g/dL   HCT 41.0 36.0 - 46.0 %   MCV 78.3 78.0 - 100.0 fl   MCHC 32.4 30.0 - 36.0 g/dL   RDW 13.9 11.5 - 15.5 %   Platelets 120.0 Large Platelets seen on smear. (L) 150.0 - 400.0 K/uL   Neutrophils Relative % 35.9 (L) 43.0 - 77.0 %   Lymphocytes Relative 42.4 12.0 - 46.0 %   Monocytes Relative (H) 3.0 - 12.0 %    19.3 Manual smear review agrees with instrument differential.   Eosinophils Relative 1.5 0.0 - 5.0 %   Basophils Relative 0.9 0.0 - 3.0 %   Neutro Abs 1.7 1.4 - 7.7 K/uL   Lymphs Abs 2.0 0.7 - 4.0 K/uL   Monocytes Absolute 0.9 0.1 - 1.0 K/uL   Eosinophils Absolute 0.1 0.0 - 0.7 K/uL   Basophils Absolute 0.0 0.0 - 0.1 K/uL  Comp Met (CMET)  Result Value Ref Range   Sodium 137 135  - 145 mEq/L   Potassium 4.3 3.5 - 5.1 mEq/L   Chloride 103 96 - 112 mEq/L   CO2 29 19 - 32 mEq/L   Glucose, Bld 118 (H) 70 - 99 mg/dL   BUN 9 6 - 23 mg/dL   Creatinine, Ser 0.78 0.40 - 1.20 mg/dL   Total Bilirubin 0.3 0.2 - 1.2 mg/dL   Alkaline Phosphatase 52 39 - 117 U/L   AST 17 0 - 37 U/L   ALT 15 0 - 35 U/L   Total Protein 7.7 6.0 - 8.3 g/dL   Albumin 4.0 3.5 - 5.2 g/dL   Calcium 9.3 8.4 - 10.5 mg/dL   GFR 91.47 >60.00 mL/min  B12  Result Value Ref Range   Vitamin B-12 921 (H) 211 - 911 pg/mL  Folate  Result Value Ref Range   Folate 16.1 >5.9 ng/mL  Tissue Transglutaminase, IGA  Result Value Ref Range   Tissue Transglutaminase Ab, IgA 1 <4 U/mL  CMV DNA, quantitative, PCR  Result Value Ref Range   CMV DNA Quant Negative Negative IU/mL   Log10 CMV Qn DNA Pl CANCELED log10 IU/mL  Epstein barr vrs(ebv dna by pcr)  Result Value Ref Range   Epstein-Barr DNA Quant, PCR Negative Negative copies/mL   log10 EBV DNA Qn PCR CANCELED log10 copy/mL    Labs Reviewed - No data to display  No results found.     ASSESSMENT & PLAN:  1. Bronchitis     Meds ordered this encounter  Medications  . chlorpheniramine-HYDROcodone (TUSSIONEX PENNKINETIC ER) 10-8 MG/5ML SUER    Sig: Take 5 mLs by mouth every 12 (twelve) hours as needed for cough.    Dispense:  100 mL    Refill:  0  . azithromycin (ZITHROMAX) 250 MG tablet    Sig: Take 1 tablet (250 mg total) by mouth daily. Take first 2 tablets together, then 1 every day until finished.    Dispense:  6 tablet    Refill:  0  . DISCONTD: amoxicillin (AMOXIL) 875 MG tablet    Sig: Take 1 tablet (875 mg total) by mouth 2 (two) times daily.    Dispense:  20 tablet    Refill:  0  . ibuprofen (ADVIL,MOTRIN) 800 MG tablet    Sig: Take 1 tablet (800 mg total) by mouth 3 (three) times daily.    Dispense:  21 tablet    Refill:  0    Reviewed expectations  re: course of current medical issues. Questions answered. Outlined signs and  symptoms indicating need for more acute intervention. Patient verbalized understanding. After Visit Summary given.    Procedures:      Robyn Haber, MD 11/01/17 7209    Robyn Haber, MD 11/01/17 [phone]

## 2017-11-01 NOTE — ED Triage Notes (Signed)
Pt has had a cough and nasal congestion for two weeks.  She reports a headache that started two days ago.  Pt has taken several different OTC medications with no relief.

## 2017-12-30 ENCOUNTER — Ambulatory Visit: Payer: BLUE CROSS/BLUE SHIELD | Admitting: Gastroenterology

## 2019-02-28 ENCOUNTER — Ambulatory Visit: Payer: Self-pay | Admitting: Surgery

## 2019-02-28 NOTE — H&P (Signed)
CC: Referred by Dr. 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 She is evaluated there and had an EGD as well as flexible sigmoidoscopy done. She was told she had a hypertrophic anal papilla and referred here. She does report a possible history of anal pain with bowel movements many years ago and was concerned she had had a prior anal fissure. She says her many years she's not had any problems with anal pain. Currently she denies any pain. She does report that the tissue prolapses with each bowel movement and has to be manually reduced - she is tired of dealing with the tissue prolapse and is wanting this addressed. She is not taking any stool softeners regularly but does PRN prunex for constipation. She drinks less than 64 ounces of water per day. She denies any blood in her stool or in the toilet bowl. She reports she has a bowl movement on average once per week. Believes she may also have IBS-C but is not being treated.  She denies any episodes of incontinence to gas, liquid, or solid stool.  She has never herself had a full colonoscopy   PMH: Seasonal allergies (well controlled with PRN OTC allergy medications); GERD (well controlled with OTC PPI)  PSH: She denies any prior anorectal procedures  She's had 2 vaginal deliveries. She denies any episiotomies or lacerations.  FHx: She reports her mother had colon cancer in her late 79s.  Social: Denies use of tobacco/EtOH/drugs. She is currently not working 2/2 COVID pandemic but previously worked [employer] cabin crew  ROS: A comprehensive 10 system review of systems was completed with the patient and pertinent findings as noted above.  The patient is a 33 year old female.   Allergies (Tonya [name], Tonya [name]; 02/07/2019 11:09 AM) No Known Drug  Allergies [02/07/2019]:  Medication History (Tonya [name], Tonya [name]; 02/07/2019 11:09 AM) Omeprazole (20MG  Tablet DR, Oral) Active. Medications Reconciled    Review of Systems Cristal Deer M. Dawnita Molner MD; 02/07/2019 11:19 AM) General Not Present- Chills and Fever. HEENT Not Present- Blurred Vision and Headache. Respiratory Not Present- Cough and Decreased Exercise Tolerance. Cardiovascular Not Present- Chest Pain, Difficulty Breathing Lying Down and Difficulty Breathing On Exertion. Gastrointestinal Not Present- Abdominal Pain, Nausea, Pain with Bowel Movement, Rectal Pain and Vomiting. Musculoskeletal Not Present- Back Pain and Decreased Range of Motion. Neurological Not Present- Decreased Memory and Difficulty Speaking. Psychiatric Not Present- Anxiety and Depression. Endocrine Not Present- Appetite Changes and Cold Intolerance. Hematology Not Present- Blood Clots and Blood Thinners.  Vitals (Tonya [name] Tonya [name]; 02/07/2019 11:09 AM) 02/07/2019 11:08 AM Weight: 137 lb Height: 65in Body Surface Area: 1.68 m Body Mass Index: 22.8 kg/m  Temp.: 98.33F(Oral)  Pulse: 87 (Regular)  BP: 110/64 (Sitting, Left Arm, Standard)       Physical Exam Cristal Deer M. Matheau Orona MD; 02/07/2019 11:42 AM) The physical exam findings are as follows: Note:Constitutional: No acute distress; conversant; no deformities Eyes: Moist conjunctiva; no lid lag; anicteric sclerae; pupils equal round and reactive to light Neck: Trachea midline; no palpable thyromegaly Lungs: Normal respiratory effort; no tactile fremitus CV: Regular rate and rhythm; no palpable thrill; no pitting edema GI: Abdomen soft, nontender, nondistended; no palpable hepatosplenomegaly Anorectal: No external skin changes or lesions. DRE- normal tone and squeeze; no palpable masses Anoscopy: No fissures or ulcerations; posterior midline hypertrophic anal  papilla vs prolapsing polypoid tissue - soft, no ulceration MSK: Normal gait; no  clubbing/cyanosis Psychiatric: Appropriate affect; alert and oriented 3 Lymphatic: No palpable cervical or axillary lymphadenopathy **A chaperone, Owens CorningJacqueline Haggett, was present for this encounter    Assessment & Plan Cristal Deer(Anum Palecek M. 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 Chapman FitchHdok is a very pleasant 33yoF with Impression: -The anatomy and physiology of the anal canal was discussed at length with the patient. The pathophysiology of hypertrophic anal papilla and polypoid tissue was discussed at length with associated pictures and illustrations. -We discussed observation vs removal. It prolapses frequently and she has requested to have it removed as it bothers her every time it prolapses. -I recommended she begin taking a daily fiber supplement, drink 64oz of water per day and minimize time on commode to 2-3 minutes as well. We also discussed a 1/2 cap of Miralax daily as she is concerned fiber will give her a lot of bloating. -The procedure, material risks (including, but not limited to, pain, bleeding, infection, scarring, need for blood transfusion, damage to anal sphincter, incontinence of gas and/or stool, need for additional procedures, recurrence, pneumonia, heart attack, stroke, death) benefits and alternatives to surgery were discussed at length. I noted a good probability that the procedure would help improve their symptoms, from an anatomic sense - we discussed this would not fix her constipation or bloating. The patient's questions were answered to her satisfaction, she voiced understanding and elected to proceed with surgery. Additionally, we discussed typical postoperative expectations and the recovery process. -We will also refer her to [facility] for colonoscopy given FHx and her concern about IBS-C as well Current Plans ANOSCOPY, DIAGNOSTIC ([id number]) (Anoscopy: No fissures or ulcerations; posterior midline hypertrophic anal papilla vs prolapsing polypoid  tissue - soft, no ulceration. Requires manual reduction) Signed electronically by Andria Meusehristopher M Jaycelyn Orrison, MD (02/07/2019 11:43 AM)

## 2019-03-24 ENCOUNTER — Telehealth: Payer: Self-pay | Admitting: *Deleted

## 2019-03-24 DIAGNOSIS — Z20822 Contact with and (suspected) exposure to covid-19: Secondary | ICD-10-CM

## 2019-03-24 NOTE — Telephone Encounter (Signed)
Pt scheduled for testing at [facility] site on 03/27/19. Pt advised to wear a mask and remain in car at appt time. Understanding verbalized.

## 2019-03-27 ENCOUNTER — Other Ambulatory Visit: Payer: BLUE CROSS/BLUE SHIELD

## 2019-03-27 DIAGNOSIS — Z20822 Contact with and (suspected) exposure to covid-19: Secondary | ICD-10-CM

## 2019-03-30 LAB — NOVEL CORONAVIRUS, NAA: SARS-CoV-2, NAA: NOT DETECTED

## 2019-04-01 ENCOUNTER — Other Ambulatory Visit (HOSPITAL_COMMUNITY)
Admission: RE | Admit: 2019-04-01 | Discharge: 2019-04-01 | Disposition: A | Discharge status: Home / Self Care | Payer: BC Managed Care – PPO | Source: Ambulatory Visit | Attending: Surgery | Admitting: Surgery

## 2019-04-01 DIAGNOSIS — Z01818 Encounter for other preprocedural examination: Secondary | ICD-10-CM | POA: Insufficient documentation

## 2019-04-01 DIAGNOSIS — Z1159 Encounter for screening for other viral diseases: Secondary | ICD-10-CM | POA: Insufficient documentation

## 2019-04-01 LAB — SARS CORONAVIRUS 2 (TAT 6-24 HRS): SARS Coronavirus 2: NEGATIVE

## 2019-04-03 ENCOUNTER — Encounter (HOSPITAL_COMMUNITY)
Admission: RE | Admit: 2019-04-03 | Discharge: 2019-04-03 | Disposition: A | Discharge status: Home / Self Care | Payer: BC Managed Care – PPO | Source: Ambulatory Visit | Attending: Surgery | Admitting: Surgery

## 2019-04-03 ENCOUNTER — Encounter (HOSPITAL_BASED_OUTPATIENT_CLINIC_OR_DEPARTMENT_OTHER): Payer: Self-pay

## 2019-04-03 ENCOUNTER — Other Ambulatory Visit: Payer: Self-pay

## 2019-04-03 DIAGNOSIS — Z1159 Encounter for screening for other viral diseases: Secondary | ICD-10-CM | POA: Diagnosis not present

## 2019-04-03 DIAGNOSIS — Z01818 Encounter for other preprocedural examination: Secondary | ICD-10-CM | POA: Diagnosis not present

## 2019-04-03 LAB — CBC WITH DIFFERENTIAL/PLATELET
Abs Immature Granulocytes: 0.01 10*3/uL (ref 0.00–0.07)
Basophils Absolute: 0 10*3/uL (ref 0.0–0.1)
Basophils Relative: 1 %
Eosinophils Absolute: 0.2 10*3/uL (ref 0.0–0.5)
Eosinophils Relative: 3 %
HCT: 43.7 % (ref 36.0–46.0)
Hemoglobin: 13.5 g/dL (ref 12.0–15.0)
Immature Granulocytes: 0 %
Lymphocytes Relative: 29 %
Lymphs Abs: 1.5 10*3/uL (ref 0.7–4.0)
MCH: 26.7 pg (ref 26.0–34.0)
MCHC: 30.9 g/dL (ref 30.0–36.0)
MCV: 86.4 fL (ref 80.0–100.0)
Monocytes Absolute: 0.3 10*3/uL (ref 0.1–1.0)
Monocytes Relative: 6 %
Neutro Abs: 3.2 10*3/uL (ref 1.7–7.7)
Neutrophils Relative %: 61 %
Platelets: 124 10*3/uL — ABNORMAL LOW (ref 150–400)
RBC: 5.06 MIL/uL (ref 3.87–5.11)
RDW: 12.6 % (ref 11.5–15.5)
WBC: 5.3 10*3/uL (ref 4.0–10.5)
nRBC: 0 % (ref 0.0–0.2)

## 2019-04-03 LAB — COMPREHENSIVE METABOLIC PANEL
ALT: 17 U/L (ref 0–44)
AST: 21 U/L (ref 15–41)
Albumin: 4.1 g/dL (ref 3.5–5.0)
Alkaline Phosphatase: 52 U/L (ref 38–126)
Anion gap: 5 (ref 5–15)
BUN: 15 mg/dL (ref 6–20)
CO2: 28 mmol/L (ref 22–32)
Calcium: 9.1 mg/dL (ref 8.9–10.3)
Chloride: 108 mmol/L (ref 98–111)
Creatinine, Ser: 0.86 mg/dL (ref 0.44–1.00)
GFR calc Af Amer: >60 mL/min (ref >60)
GFR calc non Af Amer: >60 mL/min (ref >60)
Glucose, Bld: 108 mg/dL — ABNORMAL HIGH (ref 70–99)
Potassium: 4.4 mmol/L (ref 3.5–5.1)
Sodium: 141 mmol/L (ref 135–145)
Total Bilirubin: 0.7 mg/dL (ref 0.3–1.2)
Total Protein: 7.2 g/dL (ref 6.5–8.1)

## 2019-04-03 LAB — APTT: aPTT: 34 seconds (ref 24–36)

## 2019-04-03 LAB — PROTIME-INR
INR: 1.1 (ref 0.8–1.2)
Prothrombin Time: 13.7 seconds (ref 11.4–15.2)

## 2019-04-03 NOTE — Progress Notes (Addendum)
Spoke with: Ashyla NPO:  After Midnight, no gum, candy, or mints   Arrival time:  0830AM Labs: UPT (04/03/2019 in epic) AM medications: Fleet enema Pre op orders: Yes Ride home: Randall Hiss (brother in law) [phone]

## 2019-04-03 NOTE — Progress Notes (Signed)
SPOKE W/  Lyric     SCREENING SYMPTOMS OF COVID 19:   COUGH--NO  RUNNY NOSE--- NO  SORE THROAT---NO  NASAL CONGESTION----NO  SNEEZING----NO  SHORTNESS OF BREATH---NO  DIFFICULTY BREATHING---NO  TEMP >100.0 -----NO  UNEXPLAINED BODY ACHES------NO  CHILLS --------NO   HEADACHES ---------NO  LOSS OF SMELL/ TASTE --------NO    HAVE YOU OR ANY FAMILY MEMBER TRAVELLED PAST 14 DAYS OUT OF THE   COUNTY---NO STATE----NO COUNTRY----NO  HAVE YOU OR ANY FAMILY MEMBER BEEN EXPOSED TO ANYONE WITH COVID 19? NO

## 2019-04-04 MED ORDER — BUPIVACAINE LIPOSOME 1.3 % IJ SUSP
20.0000 mL | Freq: Once | INTRAMUSCULAR | Status: DC
  Filled 2019-04-04: qty 20

## 2019-04-05 ENCOUNTER — Encounter (HOSPITAL_BASED_OUTPATIENT_CLINIC_OR_DEPARTMENT_OTHER)
Admission: RE | Disposition: A | Discharge status: Home / Self Care | Payer: Self-pay | Source: Home / Self Care | Attending: Surgery

## 2019-04-05 ENCOUNTER — Ambulatory Visit (HOSPITAL_BASED_OUTPATIENT_CLINIC_OR_DEPARTMENT_OTHER)
Admission: RE | Admit: 2019-04-05 | Discharge: 2019-04-05 | Disposition: A | Discharge status: Home / Self Care | Payer: BC Managed Care – PPO | Attending: Surgery | Admitting: Surgery

## 2019-04-05 ENCOUNTER — Encounter (HOSPITAL_BASED_OUTPATIENT_CLINIC_OR_DEPARTMENT_OTHER): Payer: Self-pay

## 2019-04-05 ENCOUNTER — Ambulatory Visit (HOSPITAL_BASED_OUTPATIENT_CLINIC_OR_DEPARTMENT_OTHER): Payer: BC Managed Care – PPO | Admitting: Anesthesiology

## 2019-04-05 DIAGNOSIS — Z79899 Other long term (current) drug therapy: Secondary | ICD-10-CM | POA: Insufficient documentation

## 2019-04-05 DIAGNOSIS — M797 Fibromyalgia: Secondary | ICD-10-CM | POA: Diagnosis not present

## 2019-04-05 DIAGNOSIS — K219 Gastro-esophageal reflux disease without esophagitis: Secondary | ICD-10-CM | POA: Diagnosis not present

## 2019-04-05 DIAGNOSIS — K644 Residual hemorrhoidal skin tags: Secondary | ICD-10-CM | POA: Insufficient documentation

## 2019-04-05 HISTORY — PX: EVALUATION UNDER ANESTHESIA WITH HEMORRHOIDECTOMY: SHX5624

## 2019-04-05 HISTORY — DX: Insomnia, unspecified: G47.00

## 2019-04-05 HISTORY — DX: Fibromyalgia: M79.7

## 2019-04-05 HISTORY — DX: Abdominal distension (gaseous): R14.0

## 2019-04-05 LAB — POCT PREGNANCY, URINE: Preg Test, Ur: NEGATIVE

## 2019-04-05 SURGERY — EXAM UNDER ANESTHESIA WITH HEMORRHOIDECTOMY
Anesthesia: General | Site: Rectum

## 2019-04-05 MED ORDER — LIDOCAINE 2% (20 MG/ML) 5 ML SYRINGE
INTRAMUSCULAR | Status: AC
  Filled 2019-04-05: qty 5

## 2019-04-05 MED ORDER — ONDANSETRON HCL 4 MG/2ML IJ SOLN
INTRAMUSCULAR | Status: AC
  Filled 2019-04-05: qty 2

## 2019-04-05 MED ORDER — FLEET ENEMA 7-19 GM/118ML RE ENEM
1.0000 | ENEMA | Freq: Once | RECTAL | Status: DC
  Filled 2019-04-05: qty 1

## 2019-04-05 MED ORDER — FLUCONAZOLE 100 MG PO TABS: 150.0000 mg | ORAL_TABLET | Freq: Once | ORAL | 0 refills | Status: AC

## 2019-04-05 MED ORDER — MEPERIDINE HCL 25 MG/ML IJ SOLN
6.2500 mg | Freq: Once | INTRAMUSCULAR | Status: AC
  Administered 2019-04-05: 6.25 mg via INTRAVENOUS
  Filled 2019-04-05: qty 1

## 2019-04-05 MED ORDER — DEXAMETHASONE SODIUM PHOSPHATE 10 MG/ML IJ SOLN
INTRAMUSCULAR | Status: AC
  Filled 2019-04-05: qty 1

## 2019-04-05 MED ORDER — BUPIVACAINE LIPOSOME 1.3 % IJ SUSP
INTRAMUSCULAR | Status: AC
  Filled 2019-04-05: qty 20

## 2019-04-05 MED ORDER — MEPERIDINE HCL 25 MG/ML IJ SOLN
INTRAMUSCULAR | Status: AC
  Filled 2019-04-05: qty 1

## 2019-04-05 MED ORDER — BUPIVACAINE-EPINEPHRINE 0.25% -1:200000 IJ SOLN
INTRAMUSCULAR | Status: DC | PRN
  Administered 2019-04-05: 30 mL

## 2019-04-05 MED ORDER — CHLORHEXIDINE GLUCONATE CLOTH 2 % EX PADS
6.0000 | MEDICATED_PAD | Freq: Once | CUTANEOUS | Status: DC
  Filled 2019-04-05: qty 6

## 2019-04-05 MED ORDER — FENTANYL CITRATE (PF) 100 MCG/2ML IJ SOLN
INTRAMUSCULAR | Status: DC | PRN
  Administered 2019-04-05 (×2): 50 ug via INTRAVENOUS

## 2019-04-05 MED ORDER — MIDAZOLAM HCL 2 MG/2ML IJ SOLN
INTRAMUSCULAR | Status: AC
  Filled 2019-04-05: qty 2

## 2019-04-05 MED ORDER — ONDANSETRON HCL 4 MG/2ML IJ SOLN
INTRAMUSCULAR | Status: DC | PRN
  Administered 2019-04-05: 4 mg via INTRAVENOUS

## 2019-04-05 MED ORDER — PROPOFOL 10 MG/ML IV BOLUS
INTRAVENOUS | Status: AC
  Filled 2019-04-05: qty 20

## 2019-04-05 MED ORDER — LIDOCAINE HCL (CARDIAC) PF 100 MG/5ML IV SOSY
PREFILLED_SYRINGE | INTRAVENOUS | Status: DC | PRN
  Administered 2019-04-05: 60 mg via INTRAVENOUS

## 2019-04-05 MED ORDER — ACETAMINOPHEN 500 MG PO TABS
1000.0000 mg | ORAL_TABLET | ORAL | Status: AC
  Administered 2019-04-05: 1000 mg via ORAL
  Filled 2019-04-05: qty 2

## 2019-04-05 MED ORDER — TRAMADOL HCL 50 MG PO TABS: 50.0000 mg | ORAL_TABLET | Freq: Four times a day (QID) | ORAL | 0 refills | Status: AC | PRN

## 2019-04-05 MED ORDER — EPHEDRINE 5 MG/ML INJ
INTRAVENOUS | Status: AC
  Filled 2019-04-05: qty 10

## 2019-04-05 MED ORDER — GABAPENTIN 300 MG PO CAPS
300.0000 mg | ORAL_CAPSULE | ORAL | Status: AC
  Administered 2019-04-05: 300 mg via ORAL
  Filled 2019-04-05: qty 1

## 2019-04-05 MED ORDER — FENTANYL CITRATE (PF) 100 MCG/2ML IJ SOLN
25.0000 ug | INTRAMUSCULAR | Status: DC | PRN
  Filled 2019-04-05: qty 1

## 2019-04-05 MED ORDER — GABAPENTIN 300 MG PO CAPS
ORAL_CAPSULE | ORAL | Status: AC
  Filled 2019-04-05: qty 1

## 2019-04-05 MED ORDER — KETOROLAC TROMETHAMINE 30 MG/ML IJ SOLN
INTRAMUSCULAR | Status: DC | PRN
  Administered 2019-04-05: 30 mg via INTRAVENOUS

## 2019-04-05 MED ORDER — MIDAZOLAM HCL 5 MG/5ML IJ SOLN
INTRAMUSCULAR | Status: DC | PRN
  Administered 2019-04-05: 2 mg via INTRAVENOUS

## 2019-04-05 MED ORDER — BUPIVACAINE LIPOSOME 1.3 % IJ SUSP
INTRAMUSCULAR | Status: DC | PRN
  Administered 2019-04-05: 20 mL

## 2019-04-05 MED ORDER — PHENYLEPHRINE HCL (PRESSORS) 10 MG/ML IV SOLN
INTRAVENOUS | Status: DC | PRN
  Administered 2019-04-05 (×3): 40 ug via INTRAVENOUS

## 2019-04-05 MED ORDER — DEXAMETHASONE SODIUM PHOSPHATE 4 MG/ML IJ SOLN
INTRAMUSCULAR | Status: DC | PRN
  Administered 2019-04-05: 10 mg via INTRAVENOUS

## 2019-04-05 MED ORDER — ACETAMINOPHEN 500 MG PO TABS
ORAL_TABLET | ORAL | Status: AC
  Filled 2019-04-05: qty 2

## 2019-04-05 MED ORDER — GLYCOPYRROLATE 0.2 MG/ML IJ SOLN
INTRAMUSCULAR | Status: DC | PRN
  Administered 2019-04-05: 0.2 mg via INTRAVENOUS

## 2019-04-05 MED ORDER — EPHEDRINE SULFATE-NACL 50-0.9 MG/10ML-% IV SOSY
PREFILLED_SYRINGE | INTRAVENOUS | Status: DC | PRN
  Administered 2019-04-05: 10 mg via INTRAVENOUS

## 2019-04-05 MED ORDER — PROPOFOL 10 MG/ML IV BOLUS
INTRAVENOUS | Status: DC | PRN
  Administered 2019-04-05: 50 mg via INTRAVENOUS
  Administered 2019-04-05: 150 mg via INTRAVENOUS

## 2019-04-05 MED ORDER — KETOROLAC TROMETHAMINE 30 MG/ML IJ SOLN
INTRAMUSCULAR | Status: AC
  Filled 2019-04-05: qty 1

## 2019-04-05 MED ORDER — FENTANYL CITRATE (PF) 100 MCG/2ML IJ SOLN
INTRAMUSCULAR | Status: AC
  Filled 2019-04-05: qty 2

## 2019-04-05 MED ORDER — LACTATED RINGERS IV SOLN
INTRAVENOUS | Status: DC
  Administered 2019-04-05 (×2): via INTRAVENOUS
  Filled 2019-04-05: qty 1000

## 2019-04-05 SURGICAL SUPPLY — 48 items
BLADE EXTENDED COATED 6.5IN (ELECTRODE) IMPLANT
BLADE HEX COATED 2.75 (ELECTRODE) IMPLANT
BLADE SURG 10 STRL SS (BLADE) IMPLANT
BLADE SURG 15 STRL LF DISP TIS (BLADE) ×1 IMPLANT
BLADE SURG 15 STRL SS (BLADE) ×2
BRIEF STRETCH FOR OB PAD LRG (UNDERPADS AND DIAPERS) ×3 IMPLANT
COVER BACK TABLE 60X90IN (DRAPES) ×3 IMPLANT
COVER MAYO STAND STRL (DRAPES) ×3 IMPLANT
COVER WAND RF STERILE (DRAPES) IMPLANT
DRAPE LAPAROTOMY 100X72 PEDS (DRAPES) ×3 IMPLANT
DRAPE LG THREE QUARTER DISP (DRAPES) ×3 IMPLANT
DRAPE UTILITY XL STRL (DRAPES) ×3 IMPLANT
ELECT REM PT RETURN 9FT ADLT (ELECTROSURGICAL) ×3
ELECTRODE REM PT RTRN 9FT ADLT (ELECTROSURGICAL) ×1 IMPLANT
GAUZE SPONGE 4X4 12PLY STRL (GAUZE/BANDAGES/DRESSINGS) ×3 IMPLANT
GAUZE SPONGE 4X4 12PLY STRL LF (GAUZE/BANDAGES/DRESSINGS) ×3 IMPLANT
GLOVE BIO SURGEON STRL SZ7.5 (GLOVE) ×3 IMPLANT
GLOVE INDICATOR 8.0 STRL GRN (GLOVE) ×3 IMPLANT
GOWN STRL REUS W/TWL XL LVL3 (GOWN DISPOSABLE) ×3 IMPLANT
KIT TURNOVER CYSTO (KITS) ×3 IMPLANT
LEGGING LITHOTOMY PAIR STRL (DRAPES) ×3 IMPLANT
NEEDLE HYPO 22GX1.5 SAFETY (NEEDLE) ×3 IMPLANT
NS IRRIG 500ML POUR BTL (IV SOLUTION) ×3 IMPLANT
PACK BASIN DAY SURGERY FS (CUSTOM PROCEDURE TRAY) ×3 IMPLANT
PAD ABD 8X10 STRL (GAUZE/BANDAGES/DRESSINGS) ×3 IMPLANT
PAD ARMBOARD 7.5X6 YLW CONV (MISCELLANEOUS) IMPLANT
PENCIL BUTTON HOLSTER BLD 10FT (ELECTRODE) ×3 IMPLANT
SPONGE HEMORRHOID 8X3CM (HEMOSTASIS) ×3 IMPLANT
SPONGE SURGIFOAM ABS GEL 100 (HEMOSTASIS) IMPLANT
SPONGE SURGIFOAM ABS GEL 12-7 (HEMOSTASIS) IMPLANT
SUT CHROMIC 2 0 SH (SUTURE) IMPLANT
SUT CHROMIC 3 0 SH 27 (SUTURE) IMPLANT
SUT MNCRL AB 4-0 PS2 18 (SUTURE) IMPLANT
SUT VIC AB 2-0 SH 27 (SUTURE)
SUT VIC AB 2-0 SH 27XBRD (SUTURE) IMPLANT
SUT VIC AB 3-0 SH 27 (SUTURE) ×6
SUT VIC AB 3-0 SH 27X BRD (SUTURE) ×3 IMPLANT
SUT VIC AB 4-0 P-3 18XBRD (SUTURE) IMPLANT
SUT VIC AB 4-0 P3 18 (SUTURE)
SUT VIC AB 4-0 SH 18 (SUTURE) IMPLANT
SUT VIC AB 4-0 SH 27 (SUTURE)
SUT VIC AB 4-0 SH 27XANBCTRL (SUTURE) IMPLANT
SYR CONTROL 10ML LL (SYRINGE) ×3 IMPLANT
TRAY DSU PREP LF (CUSTOM PROCEDURE TRAY) ×3 IMPLANT
TUBE CONNECTING 12'X1/4 (SUCTIONS) ×1
TUBE CONNECTING 12X1/4 (SUCTIONS) ×2 IMPLANT
WATER STERILE IRR 500ML POUR (IV SOLUTION) IMPLANT
YANKAUER SUCT BULB TIP NO VENT (SUCTIONS) ×3 IMPLANT

## 2019-04-05 NOTE — Transfer of Care (Signed)
Immediate Anesthesia Transfer of Care Note  Patient: Tonya [name]  Procedure(s) Performed: Procedure(s) (LRB): ANORECTAL EXAM UNDER ANESTHESIA WITH EXCISION OF ANAL CANAL POLYPOID TISSUE/PAPILLA (N/A)  Patient Location: PACU  Anesthesia Type: General  Level of Consciousness: awake, sedated, patient cooperative and responds to stimulation  Airway & Oxygen Therapy: Patient Spontanous Breathing and Patient connected to [place] O2 with soft face mask   Post-op Assessment: Report given to PACU RN, Post -op Vital signs reviewed and stable and Patient moving all extremities  Post vital signs: Reviewed and stable  Complications: No apparent anesthesia complications

## 2019-04-05 NOTE — Op Note (Signed)
04/05/2019  10:39 AM  PATIENT:  Tonya [name]  33 y.o. female  Patient Care Team: System, Pcp Not In as PCP - General  PRE-OPERATIVE DIAGNOSIS:  Prolapsing anal canal polypoid tissue  POST-OPERATIVE DIAGNOSIS:  Prolapsing anal canal polypoid tissue  PROCEDURE:   1. Hemorrhoidectomy (right anterolateral) 2. Removal of anal skin tag (posterior midline) 3. Anorectal exam under anesthesia  SURGEON:  Surgeon(s): Ileana Roup, MD  ASSISTANT: OR Staff   ANESTHESIA:   general + local  SPECIMEN:   1. Posterior midline anal canal polypoid tissue - grossly consistent with anal skin tag 2. Right anterolateral anal canal polypoid tissue - grossly consistent with hemorrhoid  DISPOSITION OF SPECIMEN:  PATHOLOGY  COUNTS:  Sponge, needle, and instrument counts were reported correct x2 at conclusion.  EBL: 20 mL  Drains: None  PLAN OF CARE: Discharge to home after PACU  PATIENT DISPOSITION:  PACU - hemodynamically stable.  INDICATION: Ms. Tonya [name] is a very pleasant 54yoF with hx of GERD and seasonal allergies whom was seen at [facility] 10/2018 with complaints of a prolapsing anal skin tag. She was evaluated there and had an EGD as well as flexible sigmoidoscopy done. 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 She does report a possible history of anal pain with bowel movements many years ago and was concerned she had had a prior anal fissure. She says for many years, she's not had any problems with anal pain. Currently she denies any pain. She does report that the tissue prolapses with each bowel movement and has to be manually reduced - she is tired of dealing with the tissue prolapse and is wanting this addressed. She is not taking any stool softeners regularly but does PRN prunex for constipation. She drinks less than 64 ounces of water per day. She denies any blood in her stool or in the toilet bowl. She reports she has a bowl movement on  average once per week. Believes she may also have IBS-C but is not being treated.  She denies any episodes of incontinence to gas, liquid, or solid stool.  She was seen and examined and noted to at least have prolapsing posterior midline anal canal tissue consistent with tag/papilla. No fissures were present. Options were discussed with her moving forward and she opted to pursue EUA/removal of prolapsing anal tissue - we discussed this could be skin tag/hypertropic papilla or something else such as hemorrhoidal tissue that has decompressed and left a skin tag behind. She opted to pursue surgical removal. Please refer to notes elsewhere for details regarding this discussion.  OR FINDINGS: On DRE, there were 2 freely prolapsing tufts of tissue - posterior midline and right anterolateral just off midline. Both had a polyp-like piece of tissue on them - presumably from chronic prolapse changes. Given these findings, we proceeded with removal of both of these pieces of tissue in a hemorrhoidal excision fashion.  DESCRIPTION: The patient was identified in the preoperative holding area and taken to the OR where she was placed on the operating room table. SCDs were placed.  General anesthesia was induced without difficulty. The patient was then positioned in high lithotomy using Allen stirrups. Pressure points were then examined and padded.  She was then prepped and draped in usual sterile fashion.  A surgical timeout was performed indicating the correct patient, procedure, positioning and need for preoperative antibiotics.  A rectal block was performed using 0.25% Marcaine with epinephrine mixed with Exparel.  A well lubricated digital rectal exam was performed which demonstrated smooth anal canal without palpable masses.  A Hill-Ferguson anoscope was into the anal canal and circumferential inspection demonstrated 2 areas of free tissue prolapse - posterior midline and right anterolateral just off midline.   Both of these pieces of tissue were ~1 cm in size and at the dentate line extending outward. They also both contained a 3-4 mm piece of polyp-like tissue on them which would be most consistent with changes related to chronic prolapse. This did not appear to by condylomatous change. The tissue was soft without ulceration. Beginning in the posterior midline, tag was marked and excised in an elliptical manner sharply taking care to preserve all fibers of the sphincter muscle in this dissection, teasing this away. The specimen was passed off. The excision site was then confirmed to be hemostatic with electrocautery. The site was closed with a running locking 3-0 vicryl suture. The anal canal was irrigated and hemostasis verified.  Attention turned to the right anterolateral tissue/ tag which was marked. Bimanual examination confirmed she had a thick recto/anovaginal septum and that the dissection would be well away from the back wall of the vagina. This was then excised in an elliptical manner sharply taking care to preserve all fibers of the sphincter muscle in this dissection, teasing this away. The specimen was also passed off separately. The excision site was then confirmed to be hemostatic with electrocautery. The site was closed with a running locking 3-0 vicryl suture. The anal canal was irrigated and hemostasis reverified.  A piece of dissolving proctofoam was placed in the anal canal. The area was then covered with 4x4 gauze and an ABD pad; all secured with mesh underwear. She was then taken out of lithotomy, awakened from general anesthesia, extubated and transferred to a stretcher for transport to PACU in satisfactory condition.  DISPOSITION: PACU in satisfactory condition.

## 2019-04-05 NOTE — H&P (Signed)
CC: Referred by Dr. Teressa LowerShadid with [facility] for possible hypertrophic anal papilla - here today for surgery  HPI: Tonya [name] is a very pleasant 33yoF with hx of GERD and seasonal allergies whom was seen at [facility] 10/2018 with complaints of a prolapsing anal skin tag. She was evaluated there and had an EGD as well as flexible sigmoidoscopy done. 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 She does report a possible history of anal pain with bowel movements many years ago and was concerned she had had a prior anal fissure. She says for many years, she's not had any problems with anal pain. Currently she denies any pain. She does report that the tissue prolapses with each bowel movement and has to be manually reduced - she is tired of dealing with the tissue prolapse and is wanting this addressed. She is not taking any stool softeners regularly but does PRN prunex for constipation. She drinks less than 64 ounces of water per day. She denies any blood in her stool or in the toilet bowl. She reports she has a bowl movement on average once per week. Believes she may also have IBS-C but is not being treated.  She denies any episodes of incontinence to gas, liquid, or solid stool.  She has never herself had a full colonoscopy   PMH: Seasonal allergies (well controlled with PRN OTC allergy medications); GERD (well controlled with OTC PPI)  PSH: She denies any prior anorectal procedures  She's had 2 vaginal deliveries. She denies any episiotomies or lacerations.  FHx: She reports her mother had colon cancer in her late 2630s.  Social: Denies use of tobacco/EtOH/drugs. She is currently not working 2/2 COVID pandemic but previously worked Air cabin crewproviding manicures  ROS: A comprehensive 10 system review of systems was completed with the patient and pertinent findings as noted above.  Past Medical History:  Diagnosis Date  . Abdominal bloating    . Anemia   . Fibromyalgia    Mild  . GERD (gastroesophageal reflux disease)   . Headache    "~ weekly" (03/27/2014)  . Hemorrhoids   . Hypokalemia   . Insomnia   . Migraines    "few times/year" (03/27/2014)  . Thrombocytopenia (HCC)     Past Surgical History:  Procedure Laterality Date  . DILATION AND CURETTAGE OF UTERUS  2006  . UPPER GI ENDOSCOPY      Family History  Problem Relation Age of Onset  . Hypertension Father   . Hyperlipidemia Father   . Gout Father   . Colon cancer Mother        Dx at 8846  . Migraines Mother   . Irritable bowel syndrome Mother   . Seizures Brother   . Irritable bowel syndrome Maternal Grandmother   . Other Other        Patient does not know if her family has had heart trouble due to most of family being in [place] with less access to healthcare    Social:  reports that she has never smoked. She has never used smokeless tobacco. She reports previous alcohol use of about 3.0 standard drinks of alcohol per week. She reports that she does not use drugs.  Allergies: No Known Allergies  Medications: I have reviewed the patient's current medications.  Results for orders placed or performed during the hospital encounter of 04/05/19 (from the past 48 hour(s))  APTT     Status: None   Collection Time:  04/03/19  2:36 PM  Result Value Ref Range   aPTT 34 24 - 36 seconds    Comment: Performed at [facility], [address] [place], [place] [address]  CBC WITH DIFFERENTIAL     Status: Abnormal   Collection Time: 04/03/19  2:36 PM  Result Value Ref Range   WBC 5.3 4.0 - 10.5 K/uL   RBC 5.06 3.87 - 5.11 MIL/uL   Hemoglobin 13.5 12.0 - 15.0 g/dL   HCT 56.243.7 13.036.0 - 86.546.0 %   MCV 86.4 80.0 - 100.0 fL   MCH 26.7 26.0 - 34.0 pg   MCHC 30.9 30.0 - 36.0 g/dL   RDW 78.412.6 69.611.5 - 29.515.5 %   Platelets 124 (L) 150 - 400 K/uL   nRBC 0.0 0.0 - 0.2 %   Neutrophils Relative % 61 %   Neutro Abs 3.2 1.7 - 7.7 K/uL   Lymphocytes Relative 29 %    Lymphs Abs 1.5 0.7 - 4.0 K/uL   Monocytes Relative 6 %   Monocytes Absolute 0.3 0.1 - 1.0 K/uL   Eosinophils Relative 3 %   Eosinophils Absolute 0.2 0.0 - 0.5 K/uL   Basophils Relative 1 %   Basophils Absolute 0.0 0.0 - 0.1 K/uL   Immature Granulocytes 0 %   Abs Immature Granulocytes 0.01 0.00 - 0.07 K/uL    Comment: Performed at [facility], [address] [place], [place] [address]  Comprehensive metabolic panel     Status: Abnormal   Collection Time: 04/03/19  2:36 PM  Result Value Ref Range   Sodium 141 135 - 145 mmol/L   Potassium 4.4 3.5 - 5.1 mmol/L   Chloride 108 98 - 111 mmol/L   CO2 28 22 - 32 mmol/L   Glucose, Bld 108 (H) 70 - 99 mg/dL   BUN 15 6 - 20 mg/dL   Creatinine, Ser 2.440.86 0.44 - 1.00 mg/dL   Calcium 9.1 8.9 - 01.010.3 mg/dL   Total Protein 7.2 6.5 - 8.1 g/dL   Albumin 4.1 3.5 - 5.0 g/dL   AST 21 15 - 41 U/L   ALT 17 0 - 44 U/L   Alkaline Phosphatase 52 38 - 126 U/L   Total Bilirubin 0.7 0.3 - 1.2 mg/dL   GFR calc non Af Amer >60 >60 mL/min   GFR calc Af Amer >60 >60 mL/min   Anion gap 5 5 - 15    Comment: Performed at [facility], [address] [place], [place] [address]  Protime-INR     Status: None   Collection Time: 04/03/19  2:36 PM  Result Value Ref Range   Prothrombin Time 13.7 11.4 - 15.2 seconds   INR 1.1 0.8 - 1.2    Comment: (NOTE) INR goal varies based on device and disease states. Performed at [facility], [address] [place], [place] [address]   Pregnancy, urine POC     Status: None   Collection Time: 04/05/19  8:19 AM  Result Value Ref Range   Preg Test, Ur NEGATIVE NEGATIVE    Comment:        THE SENSITIVITY OF THIS METHODOLOGY IS >24 mIU/mL     No results found.  ROS - all of the below systems have been reviewed with the patient and positives are indicated with bold text General: chills, fever or night sweats Eyes: blurry vision or double vision ENT: epistaxis or  sore throat Allergy/Immunology: itchy/watery eyes or nasal congestion Hematologic/Lymphatic:  bleeding problems, blood clots or swollen lymph nodes Endocrine: temperature intolerance or unexpected weight changes Breast: new or changing breast lumps or nipple discharge Resp: cough, shortness of breath, or wheezing CV: chest pain or dyspnea on exertion GI: as per HPI GU: dysuria, trouble voiding, or hematuria MSK: joint pain or joint stiffness Neuro: TIA or stroke symptoms Derm: pruritus and skin lesion changes Psych: anxiety and depression  PE Blood pressure 108/76, pulse 76, temperature 98.3 F (36.8 C), temperature source Oral, resp. rate 16, height 5' 3.75" (1.619 m), weight 62.9 kg, last menstrual period 03/06/2019, SpO2 100 %. Constitutional: NAD; conversant; no deformities Eyes: Moist conjunctiva; no lid lag; anicteric; PERRL Neck: Trachea midline; no thyromegaly Lungs: Normal respiratory effort; no tactile fremitus CV: RRR; no palpable thrills; no pitting edema GI: Abd soft, NT/ND; no palpable hepatosplenomegaly MSK: Normal gait; no clubbing/cyanosis Psychiatric: Appropriate affect; alert and oriented x3 Lymphatic: No palpable cervical or axillary lymphadenopathy  Results for orders placed or performed during the hospital encounter of 04/05/19 (from the past 48 hour(s))  APTT     Status: None   Collection Time: 04/03/19  2:36 PM  Result Value Ref Range   aPTT 34 24 - 36 seconds    Comment: Performed at [facility] [place] [address] [place], [place] [address]  CBC WITH DIFFERENTIAL     Status: Abnormal   Collection Time: 04/03/19  2:36 PM  Result Value Ref Range   WBC 5.3 4.0 - 10.5 K/uL   RBC 5.06 3.87 - 5.11 MIL/uL   Hemoglobin 13.5 12.0 - 15.0 g/dL   HCT 43.7 36.0 - 46.0 %   MCV 86.4 80.0 - 100.0 fL   MCH 26.7 26.0 - 34.0 pg   MCHC 30.9 30.0 - 36.0 g/dL   RDW 12.6 11.5 - 15.5 %   Platelets 124 (L) 150 - 400 K/uL   nRBC 0.0 0.0 - 0.2 %    Neutrophils Relative % 61 %   Neutro Abs 3.2 1.7 - 7.7 K/uL   Lymphocytes Relative 29 %   Lymphs Abs 1.5 0.7 - 4.0 K/uL   Monocytes Relative 6 %   Monocytes Absolute 0.3 0.1 - 1.0 K/uL   Eosinophils Relative 3 %   Eosinophils Absolute 0.2 0.0 - 0.5 K/uL   Basophils Relative 1 %   Basophils Absolute 0.0 0.0 - 0.1 K/uL   Immature Granulocytes 0 %   Abs Immature Granulocytes 0.01 0.00 - 0.07 K/uL    Comment: Performed at [facility], [place] [address], [place], [place] [address]  Comprehensive metabolic panel     Status: Abnormal   Collection Time: 04/03/19  2:36 PM  Result Value Ref Range   Sodium 141 135 - 145 mmol/L   Potassium 4.4 3.5 - 5.1 mmol/L   Chloride 108 98 - 111 mmol/L   CO2 28 22 - 32 mmol/L   Glucose, Bld 108 (H) 70 - 99 mg/dL   BUN 15 6 - 20 mg/dL   Creatinine, Ser 0.86 0.44 - 1.00 mg/dL   Calcium 9.1 8.9 - 10.3 mg/dL   Total Protein 7.2 6.5 - 8.1 g/dL   Albumin 4.1 3.5 - 5.0 g/dL   AST 21 15 - 41 U/L   ALT 17 0 - 44 U/L   Alkaline Phosphatase 52 38 - 126 U/L   Total Bilirubin 0.7 0.3 - 1.2 mg/dL   GFR calc non Af Amer >60 >60 mL/min   GFR calc Af Amer >60 >60 mL/min  Anion gap 5 5 - 15    Comment: Performed at [facility], [address] [place], [place] [address]  Protime-INR     Status: None   Collection Time: 04/03/19  2:36 PM  Result Value Ref Range   Prothrombin Time 13.7 11.4 - 15.2 seconds   INR 1.1 0.8 - 1.2    Comment: (NOTE) INR goal varies based on device and disease states. Performed at [facility], 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 The pathophysiology of hypertrophic anal papilla and polypoid tissue was discussed at length with associated pictures and illustrations. -We discussed observation vs removal. It prolapses frequently and she has requested to have it removed as it bothers her every time it prolapses. We discussed anorectal exam under anesthesia with removal of anal canal lesion. -The procedure, material risks (including, but not limited to, pain, bleeding, infection, scarring, need for blood transfusion, damage to anal sphincter, incontinence of gas and/or stool, need for additional procedures, recurrence, pneumonia, heart attack, stroke, death) benefits and alternatives to surgery were discussed at length. I noted a good probability that the procedure would help improve their symptoms, from an anatomic sense - we discussed this would not fix her constipation or bloating. The patient's questions were answered to her satisfaction, she voiced understanding and elected to proceed with surgery. Additionally, we discussed typical postoperative expectations and the recovery process.  Stephanie Couphristopher M. Cliffton AstersWhite, M.D. General and Colorectal Surgery [facility]

## 2019-04-05 NOTE — Anesthesia Preprocedure Evaluation (Addendum)
Anesthesia Evaluation  Patient identified by MRN, date of birth, ID band Patient awake    Reviewed: Allergy & Precautions, NPO status , Patient's Chart, lab work & pertinent test results  Airway Mallampati: I  TM Distance: >3 FB Neck ROM: Full    Dental no notable dental hx. (+) Teeth Intact, Dental Advisory Given   Pulmonary neg pulmonary ROS,    Pulmonary exam normal breath sounds clear to auscultation       Cardiovascular negative cardio ROS Normal cardiovascular exam Rhythm:Regular Rate:Normal  TTE 2015 - Left ventricle: The cavity size was normal. Systolic function was normal. The estimated ejection fraction was in the range of 55% to 60%. Wall motion was normal; there were no regional wall motion abnormalities.  - Mitral valve: There was trivial regurgitation.   Neuro/Psych  Headaches, negative psych ROS   GI/Hepatic Neg liver ROS, GERD  Controlled and Medicated,  Endo/Other  negative endocrine ROS  Renal/GU negative Renal ROS  negative genitourinary   Musculoskeletal  (+) Fibromyalgia -  Abdominal   Peds  Hematology  (+) Blood dyscrasia (plt 124), ,   Anesthesia Other Findings   Reproductive/Obstetrics                            Anesthesia Physical Anesthesia Plan  ASA: II  Anesthesia Plan: General   Post-op Pain Management:    Induction: Intravenous  PONV Risk Score and Plan: 3 and Midazolam, Dexamethasone and Ondansetron  Airway Management Planned: LMA  Additional Equipment:   Intra-op Plan:   Post-operative Plan: Extubation in OR  Informed Consent: I have reviewed the patients History and Physical, chart, labs and discussed the procedure including the risks, benefits and alternatives for the proposed anesthesia with the patient or authorized representative who has indicated his/her understanding and acceptance.     Dental advisory given  Plan Discussed with:  CRNA  Anesthesia Plan Comments:         Anesthesia Quick Evaluation

## 2019-04-05 NOTE — Discharge Instructions (Addendum)
ANORECTAL SURGERY: POST OP INSTRUCTIONS  1. DIET: Follow a light bland diet the first 24 hours after arrival home, such as soup, liquids, crackers, etc.  Be sure to include lots of fluids daily.  Avoid fast food or heavy meals as your are more likely to get nauseated.  Eat a low fat diet the next few days after surgery.    2. Take your usually prescribed home medications unless otherwise directed.  3. PAIN CONTROL: a. It is helpful to take an over-the-counter pain medication regularly for the first few days/weeks.  Choose from the following that works best for you: i. Ibuprofen (Advil, etc) Three 200mg  tabs every 6 hours as needed. ii. Acetaminophen (Tylenol, etc) 500-650mg  every 6 hours as needed After 2:30 pm  iii. NOTE: You may take both of these medications together - most patients find it most helpful when alternating between the two (i.e. Ibuprofen at 6am, tylenol at 9am, ibuprofen at 12pm ...) b. A  prescription for pain medication may have been prescribed for you at discharge.  Take your pain medication as prescribed if your pain is not adequately addressed with the above medications.  i. If you are having problems/concerns with the prescription medicine, please call us for further advice.  4. Avoid getting constipated.  Between the surgery and the pain medications, it is common to experience some constipation.  Increasing fluid intake (64oz of water per day) and taking a fiber supplement (such as Metamucil, Citrucel, FiberCon) 1-2 times a day regularly will usually help prevent this problem from occurring.  Take Miralax (over the counter) 1-2x/day while taking a narcotic pain medication. If no bowel movement after 48hours, you may additionally take a laxative like a bottle of Milk of Magnesia which can be purchased over the counter. Avoid enemas if possible as these are often painful.   5. Watch out for diarrhea.  If you have many loose bowel movements, simplify your diet to bland foods.   Stop any stool softeners and decrease your fiber supplement. If this worsens or does not improve, please call us.  6. Wash / shower every day.  If you were discharged with a dressing, you may remove this the day after your surgery. You may shower normally, getting soap/water on your wound, particularly after bowel movements. A piece of "proctofoam" (similar to a tampon but dissolvable) was intentionally placed in the anal canal at conclusion of the procedure. If when you have a bowel movement you notice a bloody piece of gel in the commode, that is what this is and it is not something to be concerned about. Some bleeding with bowel movements is normal after this surgery but this should stop between bowel movements. If bleeding persists and you are having issues with ongoing blood clots passing, please contact the on call physician and/or go to the emergency room for further evaluation.   7. Soaking in a warm bath filled a couple inches ("Sitz bath") is a great way to clean the area after a bowel movement and many patients find it is a way to soothe the area.  8. ACTIVITIES as tolerated:   a. You may resume regular (light) daily activities beginning the next day--such as daily self-care, walking, climbing stairs--gradually increasing activities as tolerated.  If you can walk 30 minutes without difficulty, it is safe to try more intense activity such as jogging, treadmill, bicycling, low-impact aerobics, etc. b. Refrain from any heavy lifting or straining for the first 2 weeks after your procedure, particularly  if your surgery was for hemorrhoids. c. Avoid activities that make your pain worse d. You may drive when you are no longer taking prescription pain medication, you can comfortably wear a seatbelt, and you can safely maneuver your car and apply brakes.  9. FOLLOW UP in our office a. Please call [facility] at [phone] to set up an appointment to see your surgeon in the office for a follow-up  appointment approximately 2-4 weeks after your surgery. b. Make sure that you call for this appointment the day you arrive home to insure a convenient appointment time.  9. If you have disability or family leave forms that need to be completed, you may have them completed by your primary care physician's office; for return to work instructions, please ask our office staff and they will be happy to assist you in obtaining this documentation   When to call us [phone]: 1. Poor pain control 2. Reactions / problems with new medications (rash/itching, etc)  3. Fever over 101.5 F (38.5 C) 4. Inability to urinate 5. Nausea/vomiting 6. Worsening swelling or bruising 7. Continued bleeding from incision. 8. Increased pain, redness, or drainage from the incision  The clinic staff is available to answer your questions during regular business hours (8:30am-5pm).  Please dont hesitate to call and ask to speak to one of our nurses for clinical concerns.   A surgeon from [facility] is always on call at the hospitals   If you have a medical emergency, go to the nearest emergency room or call 911.   [facility] [address], [address], [place], [place]  [address] ? MAIN: [phone] FAX [phone] [email or web address]   Post Anesthesia Home Care Instructions  Activity: Get plenty of rest for the remainder of the day. A responsible individual must stay with you for 24 hours following the procedure.  For the next 24 hours, DO NOT: -Drive a car -Advertising copywriterperate machinery -Drink alcoholic beverages -Take any medication unless instructed by your physician -Make any legal decisions or sign important papers.  Meals: Start with liquid foods such as gelatin or soup. Progress to regular foods as tolerated. Avoid greasy, spicy, heavy foods. If nausea and/or vomiting occur, drink only clear liquids until the nausea and/or vomiting subsides. Call your  physician if vomiting continues.  Special Instructions/Symptoms: Your throat may feel dry or sore from the anesthesia or the breathing tube placed in your throat during surgery. If this causes discomfort, gargle with warm salt water. The discomfort should disappear within 24 hours.  If you had a scopolamine patch placed behind your ear for the management of post- operative nausea and/or vomiting:  1. The medication in the patch is effective for 72 hours, after which it should be removed.  Wrap patch in a tissue and discard in the trash. Wash hands thoroughly with soap and water. 2. You may remove the patch earlier than 72 hours if you experience unpleasant side effects which may include dry mouth, dizziness or visual disturbances. 3. Avoid touching the patch. Wash your hands with soap and water after contact with the patch.    Information for Discharge Teaching: EXPAREL (bupivacaine liposome injectable suspension)   Your surgeon or anesthesiologist gave you EXPAREL(bupivacaine) to help control your pain after surgery.   EXPAREL is a local anesthetic that provides pain relief by numbing the tissue around the surgical site.  EXPAREL is designed to release pain medication over time and can control pain for up to  72 hours.  Depending on how you respond to EXPAREL, you may require less pain medication during your recovery.  Possible side effects:  Temporary loss of sensation or ability to move in the area where bupivacaine was injected.  Nausea, vomiting, constipation  Rarely, numbness and tingling in your mouth or lips, lightheadedness, or anxiety may occur.  Call your doctor right away if you think you may be experiencing any of these sensations, or if you have other questions regarding possible side effects.  Follow all other discharge instructions given to you by your surgeon or nurse. Eat a healthy diet and drink plenty of water or other fluids.  If you return to the hospital for  any reason within 96 hours following the administration of EXPAREL, it is important for health care providers to know that you have received this anesthetic. A teal colored band has been placed on your arm with the date, time and amount of EXPAREL you have received in order to alert and inform your health care providers. Please leave this armband in place for the full 96 hours following administration, and then you may remove the band.

## 2019-04-05 NOTE — Anesthesia Procedure Notes (Signed)
Procedure Name: LMA Insertion Date/Time: 04/05/2019 10:00 AM Performed by: Justice Rocher, CRNA Pre-anesthesia Checklist: Patient identified, Emergency Drugs available, Suction available and Patient being monitored Patient Re-evaluated:Patient Re-evaluated prior to induction Oxygen Delivery Method: Circle system utilized Preoxygenation: Pre-oxygenation with 100% oxygen Induction Type: IV induction Ventilation: Mask ventilation without difficulty LMA: LMA inserted LMA Size: 4.0 Number of attempts: 1 Airway Equipment and Method: Bite block Placement Confirmation: positive ETCO2 and breath sounds checked- equal and bilateral Tube secured with: Tape Dental Injury: Teeth and Oropharynx as per pre-operative assessment

## 2019-04-05 NOTE — Anesthesia Postprocedure Evaluation (Signed)
Anesthesia Post Note  Patient: Siddhi Hascall  Procedure(s) Performed: ANORECTAL EXAM UNDER ANESTHESIA WITH EXCISION OF ANAL CANAL POLYPOID TISSUE/PAPILLA (N/A Rectum)     Patient location during evaluation: PACU Anesthesia Type: General Level of consciousness: awake and alert Pain management: pain level controlled Vital Signs Assessment: post-procedure vital signs reviewed and stable Respiratory status: spontaneous breathing, nonlabored ventilation, respiratory function stable and patient connected to nasal cannula oxygen Cardiovascular status: blood pressure returned to baseline and stable Postop Assessment: no apparent nausea or vomiting Anesthetic complications: no    Last Vitals:  Vitals:   04/05/19 1115 04/05/19 1130  BP: 112/67 117/86  Pulse: 96 87  Resp: (!) 25 15  Temp:    SpO2: 100% 100%    Last Pain:  Vitals:   04/05/19 1112  TempSrc:   PainSc: Asleep                 Corgan Mormile L Akia Montalban

## 2019-04-06 ENCOUNTER — Encounter (HOSPITAL_BASED_OUTPATIENT_CLINIC_OR_DEPARTMENT_OTHER): Payer: Self-pay | Admitting: Surgery

## 2019-09-21 ENCOUNTER — Other Ambulatory Visit: Payer: Self-pay

## 2019-09-21 ENCOUNTER — Encounter: Payer: Self-pay | Admitting: Adult Health Nurse Practitioner

## 2019-09-21 ENCOUNTER — Ambulatory Visit (INDEPENDENT_AMBULATORY_CARE_PROVIDER_SITE_OTHER): Payer: BC Managed Care – PPO | Admitting: Adult Health Nurse Practitioner

## 2019-09-21 VITALS — BP 114/71 | HR 74 | Temp 97.9°F | Resp 12 | Ht 63.0 in | Wt 140.0 lb

## 2019-09-21 DIAGNOSIS — G4709 Other insomnia: Secondary | ICD-10-CM | POA: Diagnosis not present

## 2019-09-21 DIAGNOSIS — Z0000 Encounter for general adult medical examination without abnormal findings: Secondary | ICD-10-CM

## 2019-09-21 DIAGNOSIS — Z0001 Encounter for general adult medical examination with abnormal findings: Secondary | ICD-10-CM

## 2019-09-21 MED ORDER — CLONAZEPAM 0.5 MG PO TABS: ORAL_TABLET | ORAL | 1 refills | Status: DC

## 2019-09-21 MED ORDER — ESOMEPRAZOLE MAGNESIUM 20 MG PO PACK: 20.0000 mg | PACK | Freq: Every day | ORAL | 12 refills | Status: DC

## 2019-09-21 NOTE — Progress Notes (Signed)
Chief Complaint  Patient presents with  . Annual Exam    HPI   Patient presents for annual exam and labs for insurance.  Reports a hx of always feeling hungry and not finding something to eat that is tasteful or filling.  She has had an endoscopy and colonoscopy without any findings.  In the past had tried Nexium for GERD--cannot remember if helpful as she notes it was difficult to remember each night. Wishes to take a break from further medical testing at this time.   Due to this and to stress, she has some difficulty sleeping.  Cannot turn off her mind at night to sleep.  Feels fatigued.  No depression at this time.   She has eaten something today around lunch when she was cooking but states that is it.  Can check lipids today.   Problem List    Problem List: 2016-10: Bloating 2016-10: Anorexia 2016-10: Sore throat 2016-10: Abdominal pain, epigastric 2016-10: Nausea without vomiting 2015-06: Chest pain 2015-06: Febrile illness 2015-06: Thrombocytopenia ([place]) 2015-06: Anemia 2015-06: Hypokalemia 2012-11: Constipation 2012-11: Internal hemorrhoids without mention of complication 6734-19: Family history of malignant neoplasm of gastrointestinal  tract 2011-10: SHOULDER PAIN, RIGHT 2011-10: MUSCLE PAIN 2011-10: PARESTHESIA 2011-10: HEADACHE   Allergies   has No Known Allergies.  Medications   No current outpatient medications on file.   Review of Systems    Constitutional: Negative for activity change, appetite change, chills and fever.  HENT: Negative for congestion, nosebleeds, trouble swallowing and voice change.   Respiratory: Negative for cough, shortness of breath and wheezing.   Gastrointestinal: Positive for constipation, positive for feeling hungry  Genitourinary: Negative for difficulty urinating, dysuria, flank pain and hematuria.  Musculoskeletal: Negative for back pain, joint swelling, positive for muscle spasm to neck and trapezius.  Neurological:  Negative for dizziness, speech difficulty, light-headedness and numbness.  See HPI. All other review of systems negative.     Physical Exam:   Physical Examination: General appearance - alert, well appearing, and in no distress and oriented to person, place, and time Mental status - normal mood, behavior, speech, dress, motor activity, and thought processes Eyes - PERRL. EOM intact.  Neck - supple, no significant adenopathy, carotids upstroke normal bilaterally, no bruits, thyroid exam: thyroid is normal in size without nodules or tenderness Chest - clear to auscultation, no wheezes, rales or rhonchi, symmetric air entry  Heart - normal rate, regular rhythm, normal S1, S2, no murmurs, rubs, clicks or gallops Extremities - dependent LE edema without clubbing or cyanosis Musculoskeletal:  Trapezius muscle spasm to right  Skin - normal coloration and turgor, no rashes, no suspicious skin lesions noted  No hyperpigmentation of skin.  No current hematomas noted   Lab Review   labs reviewed, I note that hemogram normal, shows thrombocytopenia from 6/20. Marland Kitchen   Assessment & Plan:   1. Annual physical exam   2. Other insomnia    Orders Placed This Encounter  Procedures  . Lipid panel  . CMP14+EGFR   Meds ordered this encounter  Medications  . clonazePAM (KLONOPIN) 0.5 MG tablet    Sig: 1/2 to 1 tab qhs for sleep    Dispense:  30 tablet    Refill:  1  . esomeprazole (NEXIUM) 20 MG packet    Sig: Take 20 mg by mouth daily before breakfast.    Dispense:  30 each    Refill:  12   Will try the above medications and f/u as needed.  She is  inline with this plan.   Glyn Ade, NP   Glyn Ade, NP

## 2019-09-22 DIAGNOSIS — Z0000 Encounter for general adult medical examination without abnormal findings: Secondary | ICD-10-CM | POA: Insufficient documentation

## 2019-09-22 DIAGNOSIS — G4709 Other insomnia: Secondary | ICD-10-CM | POA: Insufficient documentation

## 2019-09-22 LAB — CMP14+EGFR
ALT: 18 IU/L (ref 0–32)
AST: 19 IU/L (ref 0–40)
Albumin/Globulin Ratio: 1.8 (ref 1.2–2.2)
Albumin: 4.4 g/dL (ref 3.8–4.8)
Alkaline Phosphatase: 60 IU/L (ref 39–117)
BUN/Creatinine Ratio: 20 (ref 9–23)
BUN: 14 mg/dL (ref 6–20)
Bilirubin Total: 0.4 mg/dL (ref 0.0–1.2)
CO2: 22 mmol/L (ref 20–29)
Calcium: 9.3 mg/dL (ref 8.7–10.2)
Chloride: 103 mmol/L (ref 96–106)
Creatinine, Ser: 0.71 mg/dL (ref 0.57–1.00)
GFR calc Af Amer: 129 mL/min/1.73 (ref >59)
GFR calc non Af Amer: 111 mL/min/1.73 (ref >59)
Globulin, Total: 2.5 g/dL (ref 1.5–4.5)
Glucose: 87 mg/dL (ref 65–99)
Potassium: 3.8 mmol/L (ref 3.5–5.2)
Sodium: 137 mmol/L (ref 134–144)
Total Protein: 6.9 g/dL (ref 6.0–8.5)

## 2019-09-22 LAB — LIPID PANEL
Chol/HDL Ratio: 2.7 ratio (ref 0.0–4.4)
Cholesterol, Total: 189 mg/dL (ref 100–199)
HDL: 70 mg/dL (ref >39)
LDL Chol Calc (NIH): 109 mg/dL — ABNORMAL HIGH (ref 0–99)
Triglycerides: 54 mg/dL (ref 0–149)
VLDL Cholesterol Cal: 10 mg/dL (ref 5–40)

## 2019-12-13 ENCOUNTER — Ambulatory Visit: Payer: BC Managed Care – PPO | Attending: Internal Medicine

## 2019-12-13 DIAGNOSIS — Z20822 Contact with and (suspected) exposure to covid-19: Secondary | ICD-10-CM

## 2019-12-14 LAB — NOVEL CORONAVIRUS, NAA: SARS-CoV-2, NAA: NOT DETECTED

## 2021-04-17 ENCOUNTER — Ambulatory Visit: Payer: BC Managed Care – PPO | Attending: Internal Medicine

## 2021-04-17 DIAGNOSIS — Z20822 Contact with and (suspected) exposure to covid-19: Secondary | ICD-10-CM

## 2021-04-18 LAB — NOVEL CORONAVIRUS, NAA: SARS-CoV-2, NAA: NOT DETECTED

## 2021-04-18 LAB — SARS-COV-2, NAA 2 DAY TAT

## 2023-04-29 NOTE — Progress Notes (Signed)
[name] [address] [place] [address] Phone: [phone] Subjective:   Bruce Donath, am serving as a scribe for Dr. Antoine Primas.  I'm seeing this patient by the request  of:  Royal Hawthorn, NP  CC: muscle pain follow up   ION:[id number]  Tonya [name] is a 37 y.o. female coming in with complaint of muscle aches. Patient states that she is in constant pain. States that her pain is muscluar and nerve. Patient said that her skin even hurts. Chronic back pain, throacic spine pain, R arm numbness. Legs go numb when she is seated. Also mentions 2nd toe on R foot is numb more often than not. Tries to stay active in garden and chasing chicken. Was seen chiro, massage, accupuncture. Has been taking magnesium at night prn. Notes stomach discomfort with a lot of meds even Tylenol.    Previous work up ferritin 21 low iron     Past Medical History:  Diagnosis Date   Abdominal bloating    Anemia    Fibromyalgia    Mild   GERD (gastroesophageal reflux disease)    Headache    "~ weekly" (03/27/2014)   Hemorrhoids    Hypokalemia    Insomnia    Migraines    "few times/year" (03/27/2014)   Thrombocytopenia ([facility])    Past Surgical History:  Procedure Laterality Date   DILATION AND CURETTAGE OF UTERUS  2006   EVALUATION UNDER ANESTHESIA WITH HEMORRHOIDECTOMY N/A 04/05/2019   Procedure: ANORECTAL EXAM UNDER ANESTHESIA WITH EXCISION OF ANAL CANAL POLYPOID TISSUE/PAPILLA;  Surgeon: Andria Meuse, MD;  Location: [facility];  Service: General;  Laterality: N/A;   UPPER GI ENDOSCOPY     Social History   Socioeconomic History   Marital status: Married    Spouse name: Not on file   Number of children: 1   Years of education: Not on file   Highest education level: Not on file  Occupational History   Occupation: manicurist    Employer: [employer]  Tobacco Use   Smoking status: Never   Smokeless tobacco: Never  Vaping Use    Vaping status: Never Used  Substance and Sexual Activity   Alcohol use: Not Currently    Alcohol/week: 3.0 standard drinks of alcohol    Types: 3 Glasses of wine per week   Drug use: No   Sexual activity: Yes    Birth control/protection: None  Other Topics Concern   Not on file  Social History Narrative   She moved to the [place] from [place] around age 32.   Social Determinants of Health   Financial Resource Strain: Not on file  Food Insecurity: Not on file  Transportation Needs: Not on file  Physical Activity: Not on file  Stress: Not on file  Social Connections: Not on file   No Known Allergies Family History  Problem Relation Age of Onset   Hypertension Father    Hyperlipidemia Father    Gout Father    Colon cancer Mother        Dx at 52   Migraines Mother    Irritable bowel syndrome Mother    Seizures Brother    Irritable bowel syndrome Maternal Grandmother    Other Other        Patient does not know if her family has had heart trouble due to most of family being in [place] with less access to healthcare  Current Outpatient Medications (Other):    clonazePAM (KLONOPIN) 0.5 MG tablet, 1/2 to 1 tab qhs for sleep   esomeprazole (NEXIUM) 20 MG packet, Take 20 mg by mouth daily before breakfast.   venlafaxine XR (EFFEXOR XR) 37.5 MG 24 hr capsule, Take 1 capsule (37.5 mg total) by mouth daily with breakfast.   Reviewed prior external information including notes and imaging from  primary care provider As well as notes that were available from care everywhere and other healthcare systems.  Past medical history, social, surgical and family history all reviewed in electronic medical record.  No pertanent information unless stated regarding to the chief complaint.   Review of Systems:  No  nausea, vomiting, diarrhea, constipationabdominal pain, skin rash, fevers, chills, night sweats, weight loss, swollen lymph nodes, body aches, joint swelling, chest pain,  shortness of breath, mood changes. POSITIVE muscle aches, headache, visual changes, dizziness  Objective  Blood pressure 110/78, pulse 73, height 5\' 3"  (1.6 m), weight 143 lb (64.9 kg), SpO2 99%.   General: No apparent distress alert and oriented x3 mood and affect normal, dressed appropriately.  HEENT: Pupils equal, extraocular movements intact patient does have fullness noted though of the neck.  Seems like the thyroid is moderately enlarged with me.  Minor goiter. Respiratory: Patient's speak in full sentences and does not appear short of breath  Cardiovascular: No lower extremity edema, non tender, no erythema  Patient has a relatively good range of motion but is tender to palpation diffusely in multiple areas. Patient does have difficulty going from a seated to standing position with some mild dizziness. Patient also does have a very mild wide-based gait compared to what would be anticipated.   Impression and Recommendations:     The above documentation has been reviewed and is accurate and complete Judi Saa, DO

## 2023-05-03 ENCOUNTER — Ambulatory Visit (INDEPENDENT_AMBULATORY_CARE_PROVIDER_SITE_OTHER): Payer: 59 | Admitting: Family Medicine

## 2023-05-03 ENCOUNTER — Encounter: Payer: Self-pay | Admitting: Family Medicine

## 2023-05-03 VITALS — BP 110/78 | HR 73 | Ht 63.0 in | Wt 143.0 lb

## 2023-05-03 DIAGNOSIS — M2550 Pain in unspecified joint: Secondary | ICD-10-CM | POA: Diagnosis not present

## 2023-05-03 DIAGNOSIS — R519 Headache, unspecified: Secondary | ICD-10-CM | POA: Diagnosis not present

## 2023-05-03 DIAGNOSIS — G8929 Other chronic pain: Secondary | ICD-10-CM | POA: Diagnosis not present

## 2023-05-03 LAB — COMPREHENSIVE METABOLIC PANEL
ALT: 17 U/L (ref 0–35)
AST: 18 U/L (ref 0–37)
Albumin: 4.5 g/dL (ref 3.5–5.2)
Alkaline Phosphatase: 51 U/L (ref 39–117)
BUN: 14 mg/dL (ref 6–23)
CO2: 25 mEq/L (ref 19–32)
Calcium: 9.5 mg/dL (ref 8.4–10.5)
Chloride: 103 mEq/L (ref 96–112)
Creatinine, Ser: 0.83 mg/dL (ref 0.40–1.20)
GFR: 89.52 mL/min (ref >60.00)
Glucose, Bld: 105 mg/dL — ABNORMAL HIGH (ref 70–99)
Potassium: 4 mEq/L (ref 3.5–5.1)
Sodium: 135 mEq/L (ref 135–145)
Total Bilirubin: 0.9 mg/dL (ref 0.2–1.2)
Total Protein: 7.8 g/dL (ref 6.0–8.3)

## 2023-05-03 LAB — CBC WITH DIFFERENTIAL/PLATELET
Basophils Absolute: 0 10*3/uL (ref 0.0–0.1)
Basophils Relative: 1.2 % (ref 0.0–3.0)
Eosinophils Absolute: 0.2 10*3/uL (ref 0.0–0.7)
Eosinophils Relative: 3.6 % (ref 0.0–5.0)
HCT: 45.2 % (ref 36.0–46.0)
Hemoglobin: 14.3 g/dL (ref 12.0–15.0)
Lymphocytes Relative: 35.6 % (ref 12.0–46.0)
Lymphs Abs: 1.5 10*3/uL (ref 0.7–4.0)
MCHC: 31.6 g/dL (ref 30.0–36.0)
MCV: 81.5 fl (ref 78.0–100.0)
Monocytes Absolute: 0.3 10*3/uL (ref 0.1–1.0)
Monocytes Relative: 6.2 % (ref 3.0–12.0)
Neutro Abs: 2.3 10*3/uL (ref 1.4–7.7)
Neutrophils Relative %: 53.4 % (ref 43.0–77.0)
Platelets: 107 10*3/uL — ABNORMAL LOW (ref 150.0–400.0)
RBC: 5.54 Mil/uL — ABNORMAL HIGH (ref 3.87–5.11)
RDW: 13.4 % (ref 11.5–15.5)
WBC: 4.3 10*3/uL (ref 4.0–10.5)

## 2023-05-03 LAB — VITAMIN D 25 HYDROXY (VIT D DEFICIENCY, FRACTURES): VITD: 22.46 ng/mL — ABNORMAL LOW (ref 30.00–100.00)

## 2023-05-03 LAB — T3, FREE: T3, Free: 3.5 pg/mL (ref 2.3–4.2)

## 2023-05-03 LAB — URIC ACID: Uric Acid, Serum: 5.5 mg/dL (ref 2.4–7.0)

## 2023-05-03 LAB — VITAMIN B12: Vitamin B-12: 707 pg/mL (ref 211–911)

## 2023-05-03 LAB — IBC PANEL
Iron: 176 ug/dL — ABNORMAL HIGH (ref 42–145)
Saturation Ratios: 48.5 % (ref 20.0–50.0)
TIBC: 362.6 ug/dL (ref 250.0–450.0)
Transferrin: 259 mg/dL (ref 212.0–360.0)

## 2023-05-03 LAB — SEDIMENTATION RATE: Sed Rate: 40 mm/hr — ABNORMAL HIGH (ref 0–20)

## 2023-05-03 LAB — C-REACTIVE PROTEIN: CRP: <1 mg/dL (ref 0.5–20.0)

## 2023-05-03 LAB — TSH: TSH: 1.89 u[IU]/mL (ref 0.35–5.50)

## 2023-05-03 LAB — T4, FREE: Free T4: 0.71 ng/dL (ref 0.60–1.60)

## 2023-05-03 LAB — FERRITIN: Ferritin: 29.3 ng/mL (ref 10.0–291.0)

## 2023-05-03 MED ORDER — VENLAFAXINE HCL ER 37.5 MG PO CP24: 37.5000 mg | ORAL_CAPSULE | Freq: Every day | ORAL | 0 refills | Status: DC

## 2023-05-03 NOTE — Patient Instructions (Addendum)
All labs [facility] MRI brain Effexor 37.5mg   See me in 6-8 weeks

## 2023-05-03 NOTE — Assessment & Plan Note (Signed)
Started Effexor today. Multiple different complaints over the years.  Has had some difficulty with iron deficiency in the past when reviewing patient's labs.  Discussed with patient about icing regimen and home exercises, discussed which activities to do and which he has to avoid.  We discussed with patient that depending on laboratory workup went to have different treatment options could be beneficial.  Warned of side effects of the Effexor but I do think will help with some of the pain as well as some of the anxiety as well.  Follow-up with me again in 6 to 8 weeks.

## 2023-05-03 NOTE — Assessment & Plan Note (Signed)
Out of proportion to what would be anticipated.  Does have some difficulty with seated to standing.  Increasing neck pain.  Some signs and symptoms with the dizziness and lightheadedness that would be concerning for potential intracranial hypertension.  Would like to get MRI to further evaluate with patient having this for quite some time and being disabling to a certain degree.  Follow-up with me after the MRI to discuss further treatment options.

## 2023-05-06 ENCOUNTER — Other Ambulatory Visit: Payer: Self-pay

## 2023-05-06 MED ORDER — VITAMIN D (ERGOCALCIFEROL) 1.25 MG (50000 UNIT) PO CAPS: 50000.0000 [IU] | ORAL_CAPSULE | ORAL | 0 refills | Status: AC

## 2023-05-15 ENCOUNTER — Other Ambulatory Visit: Payer: 59

## 2023-05-21 ENCOUNTER — Telehealth: Payer: Self-pay | Admitting: Family Medicine

## 2023-05-21 NOTE — Telephone Encounter (Signed)
Patient returned called. Gave Dr Edison Pace response. She expressed understanding.

## 2023-05-21 NOTE — Telephone Encounter (Signed)
Left message for patient call back

## 2023-05-21 NOTE — Telephone Encounter (Signed)
Patient called stating that she had a vertigo episode yesterday and fell. She said that since then, she has been having tension in her left neck and dull pain in the temple area of her head.  She said that when she fell, she thinks that she did hit her head.  She requested a visit with Dr Katrinka Blazing or someone else in the office. Dr Katrinka Blazing is out today and we do not have any availability at this time.   Would you be able to contact the patient to discuss further before the weekend?

## 2023-05-24 ENCOUNTER — Other Ambulatory Visit: Payer: 59

## 2023-05-30 ENCOUNTER — Ambulatory Visit
Admission: RE | Admit: 2023-05-30 | Discharge: 2023-05-30 | Disposition: A | Discharge status: Home / Self Care | Payer: 59 | Source: Ambulatory Visit | Attending: Family Medicine | Admitting: Family Medicine

## 2023-05-30 DIAGNOSIS — G8929 Other chronic pain: Secondary | ICD-10-CM

## 2023-06-01 ENCOUNTER — Other Ambulatory Visit: Payer: Self-pay | Admitting: Family Medicine

## 2023-06-01 DIAGNOSIS — Q282 Arteriovenous malformation of cerebral vessels: Secondary | ICD-10-CM

## 2023-06-11 NOTE — Progress Notes (Unsigned)
[name] [address] [place] [address] Phone: [phone] Subjective:   INadine Counts, am serving as a scribe for Dr. Antoine Primas.  I'm seeing this patient by the request  of:  Royal Hawthorn, NP  CC: Back pain follow-up  VHQ:[id number]  05/03/2023 Out of proportion to what would be anticipated.  Does have some difficulty with seated to standing.  Increasing neck pain.  Some signs and symptoms with the dizziness and lightheadedness that would be concerning for potential intracranial hypertension.  Would like to get MRI to further evaluate with patient having this for quite some time and being disabling to a certain degree.  Follow-up with me after the MRI to discuss further treatment options.     Started Effexor today. Multiple different complaints over the years.  Has had some difficulty with iron deficiency in the past when reviewing patient's labs.  Discussed with patient about icing regimen and home exercises, discussed which activities to do and which he has to avoid.  We discussed with patient that depending on laboratory workup went to have different treatment options could be beneficial.  Warned of side effects of the Effexor but I do think will help with some of the pain as well as some of the anxiety as well.  Follow-up with me again in 6 to 8 weeks.  Update 06/14/2023 Raelee Dehn is a 37 y.o. female coming in with complaint of polyarthralgia. Patient referred to neurosurgery after MRI. Patient states cough and severe chest congestion the last couple of weeks. Go over MRI and lab work. Vaginal odor and itchiness.  MRI brain 05/30/2023 IMPRESSION: 1.  No evidence of an acute intracranial abnormality. 2. 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 3. Otherwise unremarkable non-contrast MRI appearance of the brain. 4. Mild paranasal sinus mucosal thickening.      Past Medical History:  Diagnosis Date   Abdominal bloating    Anemia    Fibromyalgia    Mild   GERD (gastroesophageal reflux disease)    Headache    "~ weekly" (03/27/2014)   Hemorrhoids    Hypokalemia    Insomnia    Migraines    "few times/year" (03/27/2014)   Thrombocytopenia ([facility])    Past Surgical History:  Procedure Laterality Date   DILATION AND CURETTAGE OF UTERUS  2006   EVALUATION UNDER ANESTHESIA WITH HEMORRHOIDECTOMY N/A 04/05/2019   Procedure: ANORECTAL EXAM UNDER ANESTHESIA WITH EXCISION OF ANAL CANAL POLYPOID TISSUE/PAPILLA;  Surgeon: Andria Meuse, MD;  Location: [facility];  Service: General;  Laterality: N/A;   UPPER GI ENDOSCOPY     Social History   Socioeconomic History   Marital status: Married    Spouse name: Not on file   Number of children: 1   Years of education: Not on file   Highest education level: Not on file  Occupational History   Occupation: manicurist    Employer: [employer]  Tobacco Use   Smoking status: Never   Smokeless tobacco: Never  Vaping Use   Vaping status: Never Used  Substance and Sexual Activity   Alcohol use: Not Currently    Alcohol/week: 3.0 standard drinks of alcohol    Types: 3 Glasses of wine per week   Drug use: No   Sexual activity: Yes    Birth control/protection: None  Other Topics Concern   Not on file  Social History Narrative   She moved to the  [place] from [place] around age 89.   Social Determinants of Health   Financial Resource Strain: Not on file  Food Insecurity: Not on file  Transportation Needs: Not on file  Physical Activity: Not on file  Stress: Not on file  Social Connections: Not on file   No Known Allergies Family History  Problem Relation Age of Onset   Hypertension Father    Hyperlipidemia Father    Gout Father    Colon cancer Mother        Dx at 55   Migraines Mother    Irritable bowel syndrome Mother    Seizures Brother    Irritable bowel syndrome Maternal  Grandmother    Other Other        Patient does not know if her family has had heart trouble due to most of family being in [place] with less access to healthcare      Current Outpatient Medications (Respiratory):    albuterol (VENTOLIN HFA) 108 (90 Base) MCG/ACT inhaler, Inhale 2 puffs into the lungs every 6 (six) hours as needed for wheezing or shortness of breath.   benzonatate (TESSALON) 100 MG capsule, Take 1 capsule (100 mg total) by mouth 3 (three) times daily as needed.    Current Outpatient Medications (Other):    azithromycin (ZITHROMAX) 250 MG tablet, Take 2 tablets on day 1, then 1 tablet daily on days 2 through 5   clonazePAM (KLONOPIN) 0.5 MG tablet, 1/2 to 1 tab qhs for sleep   esomeprazole (NEXIUM) 20 MG packet, Take 20 mg by mouth daily before breakfast.   venlafaxine XR (EFFEXOR XR) 37.5 MG 24 hr capsule, Take 1 capsule (37.5 mg total) by mouth daily with breakfast.   Vitamin D, Ergocalciferol, (DRISDOL) 1.25 MG (50000 UNIT) CAPS capsule, Take 1 capsule (50,000 Units total) by mouth every 7 (seven) days.   Reviewed prior external information including notes and imaging from  primary care provider As well as notes that were available from care everywhere and other healthcare systems.  Past medical history, social, surgical and family history all reviewed in electronic medical record.  No pertanent information unless stated regarding to the chief complaint.   Review of Systems:  No headache, visual changes, nausea, vomiting, diarrhea, constipation, dizziness, abdominal pain, skin rash, chills, night sweats, weight loss, swollen lymph nodes, body aches, joint swelling, chest pain, shortness of breath, mood changes. POSITIVE muscle aches, cough recently, intermittent fevers, body aches  Objective  Blood pressure 110/84, pulse 81, height 5\' 3"  (1.6 m), weight 143 lb (64.9 kg), SpO2 98%.   General: No apparent distress alert and oriented x3 mood and affect normal, dressed  appropriately.  HEENT: Pupils equal, extraocular movements intact  Respiratory: Patient's speak in full sentences and does not appear short of breath patient does have a cough noted and seems to be more secondary to a postnasal drip with no wheezing noted. Cardiovascular: No lower extremity edema, non tender, no erythema  Patient is sitting relatively comfortable in the chair.    Impression and Recommendations:    The above documentation has been reviewed and is accurate and complete Judi Saa, DO

## 2023-06-14 ENCOUNTER — Telehealth: Payer: 59 | Admitting: Nurse Practitioner

## 2023-06-14 ENCOUNTER — Encounter: Payer: Self-pay | Admitting: Family Medicine

## 2023-06-14 ENCOUNTER — Ambulatory Visit (INDEPENDENT_AMBULATORY_CARE_PROVIDER_SITE_OTHER): Payer: 59 | Admitting: Family Medicine

## 2023-06-14 ENCOUNTER — Telehealth: Payer: Self-pay | Admitting: Family Medicine

## 2023-06-14 VITALS — BP 110/84 | HR 81 | Ht 63.0 in | Wt 143.0 lb

## 2023-06-14 DIAGNOSIS — J40 Bronchitis, not specified as acute or chronic: Secondary | ICD-10-CM

## 2023-06-14 DIAGNOSIS — M2550 Pain in unspecified joint: Secondary | ICD-10-CM | POA: Diagnosis not present

## 2023-06-14 DIAGNOSIS — J029 Acute pharyngitis, unspecified: Secondary | ICD-10-CM | POA: Diagnosis not present

## 2023-06-14 MED ORDER — AZITHROMYCIN 250 MG PO TABS: ORAL_TABLET | ORAL | 0 refills | Status: AC

## 2023-06-14 MED ORDER — BENZONATATE 100 MG PO CAPS
100.0000 mg | ORAL_CAPSULE | Freq: Three times a day (TID) | ORAL | 0 refills | Status: AC | PRN
Start: 2023-06-14 — End: ?

## 2023-06-14 MED ORDER — ALBUTEROL SULFATE HFA 108 (90 BASE) MCG/ACT IN AERS
2.0000 | INHALATION_SPRAY | Freq: Four times a day (QID) | RESPIRATORY_TRACT | 0 refills | Status: AC | PRN
Start: 2023-06-14 — End: ?

## 2023-06-14 MED ORDER — KETOROLAC TROMETHAMINE 30 MG/ML IJ SOLN
30.0000 mg | Freq: Once | INTRAMUSCULAR | Status: AC
Start: 2023-06-14 — End: 2023-06-14
  Administered 2023-06-14: 30 mg via INTRAMUSCULAR

## 2023-06-14 MED ORDER — METHYLPREDNISOLONE ACETATE 40 MG/ML IJ SUSP
40.0000 mg | Freq: Once | INTRAMUSCULAR | Status: AC
Start: 2023-06-14 — End: 2023-06-14
  Administered 2023-06-14: 40 mg via INTRAMUSCULAR

## 2023-06-14 NOTE — Assessment & Plan Note (Addendum)
Patient took the Effexor 1 time and felt like she had nausea and discontinued it.  We discussed different medications and patient was very standoffish on it which she would potentially take and not take.  We discussed the potential referral as well to pain management to discuss naltrexone but states that she gets a runny nose with any type of nose spray.  We discussed with patient if she is not willing to try any treatment options do not know what we have available for this individual.  Discussed still the vitamin supplementation.  We did discuss that patient did have an elevated sedimentation rate and could refer her to rheumatology but she states that she has seen rheumatology multiple times without any significant diagnosis.  We did discuss with her about the abnormal MRI with the venous anomaly but does need to be seen by neurosurgeon.  Patient states she does not know if she will go through with this or not.  Discussed with patient that she can follow-up with me in 3 months if she wants to discuss further. Total time with patient as well as reviewing chart 32 minutes.  Patient also given Toradol and Depo-Medrol today.

## 2023-06-14 NOTE — Telephone Encounter (Signed)
Na

## 2023-06-14 NOTE — Patient Instructions (Signed)
Injection today If cough and other problems do not get better please seek medical attention at an urgent care or with PCP We can consider referring you to pain manage for naltrexone  Please go to neurosurgery  See you again in 3 months

## 2023-06-14 NOTE — Progress Notes (Signed)
E-Visit for Cough  We are sorry that you are not feeling well.  Here is how we plan to help!  Based on your presentation I believe you most likely have A cough due to bacteria.  When patients have a fever and a productive cough with a change in color or increased sputum production, we are concerned about bacterial bronchitis.  If left untreated it can progress to pneumonia.  If your symptoms do not improve with your treatment plan it is important that you contact your provider.   I have prescribed Azithromyin 250 mg: two tablets now and then one tablet daily for 4 additonal days    In addition you may use A prescription cough medication called Tessalon Perles 100mg . You may take 1-2 capsules every 8 hours as needed for your cough.  We will also send in a new inhaler for you to try as well  Meds ordered this encounter  Medications   azithromycin (ZITHROMAX) 250 MG tablet    Sig: Take 2 tablets on day 1, then 1 tablet daily on days 2 through 5    Dispense:  6 tablet    Refill:  0   benzonatate (TESSALON) 100 MG capsule    Sig: Take 1 capsule (100 mg total) by mouth 3 (three) times daily as needed.    Dispense:  30 capsule    Refill:  0   albuterol (VENTOLIN HFA) 108 (90 Base) MCG/ACT inhaler    Sig: Inhale 2 puffs into the lungs every 6 (six) hours as needed for wheezing or shortness of breath.    Dispense:  8 g    Refill:  0      From your responses in the eVisit questionnaire you describe inflammation in the upper respiratory tract which is causing a significant cough.  This is commonly called Bronchitis and has four common causes:   Allergies Viral Infections Acid Reflux Bacterial Infection Allergies, viruses and acid reflux are treated by controlling symptoms or eliminating the cause. An example might be a cough caused by taking certain blood pressure medications. You stop the cough by changing the medication. Another example might be a cough caused by acid reflux. Controlling the  reflux helps control the cough.  USE OF BRONCHODILATOR ("RESCUE") INHALERS: There is a risk from using your bronchodilator too frequently.  The risk is that over-reliance on a medication which only relaxes the muscles surrounding the breathing tubes can reduce the effectiveness of medications prescribed to reduce swelling and congestion of the tubes themselves.  Although you feel brief relief from the bronchodilator inhaler, your asthma may actually be worsening with the tubes becoming more swollen and filled with mucus.  This can delay other crucial treatments, such as oral steroid medications. If you need to use a bronchodilator inhaler daily, several times per day, you should discuss this with your provider.  There are probably better treatments that could be used to keep your asthma under control.     HOME CARE Only take medications as instructed by your medical team. Complete the entire course of an antibiotic. Drink plenty of fluids and get plenty of rest. Avoid close contacts especially the very young and the elderly Cover your mouth if you cough or cough into your sleeve. Always remember to wash your hands A steam or ultrasonic humidifier can help congestion.   GET HELP RIGHT AWAY IF: You develop worsening fever. You become short of breath You cough up blood. Your symptoms persist after you have completed your  treatment plan MAKE SURE YOU  Understand these instructions. Will watch your condition. Will get help right away if you are not doing well or get worse.    Thank you for choosing an e-visit.  Your e-visit answers were reviewed by a board certified advanced clinical practitioner to complete your personal care plan. Depending upon the condition, your plan could have included both over the counter or prescription medications.  Please review your pharmacy choice. Make sure the pharmacy is open so you can pick up prescription now. If there is a problem, you may contact your  provider through [employer] and have the prescription routed to another pharmacy.  Your safety is important to [place]. If you have drug allergies check your prescription carefully.   For the next 24 hours you can use MyChart to ask questions about today's visit, request a non-urgent call back, or ask for a work or school excuse. You will get an email in the next two days asking about your experience. I hope that your e-visit has been valuable and will speed your recovery.   I spent approximately 5 minutes reviewing the patient's history, current symptoms and coordinating their care today.

## 2023-06-14 NOTE — Assessment & Plan Note (Signed)
Patient has sore throat and cough and encouraged her to follow-up with her primary care provider for this or urgent care.

## 2023-07-04 ENCOUNTER — Telehealth: Payer: 59 | Admitting: Family

## 2023-07-04 DIAGNOSIS — B9689 Other specified bacterial agents as the cause of diseases classified elsewhere: Secondary | ICD-10-CM | POA: Diagnosis not present

## 2023-07-04 DIAGNOSIS — J208 Acute bronchitis due to other specified organisms: Secondary | ICD-10-CM | POA: Diagnosis not present

## 2023-07-04 MED ORDER — BENZONATATE 100 MG PO CAPS
100.0000 mg | ORAL_CAPSULE | Freq: Three times a day (TID) | ORAL | 0 refills | Status: DC | PRN
Start: 2023-07-04 — End: 2024-02-14

## 2023-07-04 MED ORDER — PREDNISONE 10 MG (21) PO TBPK: ORAL_TABLET | ORAL | 0 refills | Status: DC

## 2023-07-04 MED ORDER — DOXYCYCLINE HYCLATE 100 MG PO TABS
100.0000 mg | ORAL_TABLET | Freq: Two times a day (BID) | ORAL | 0 refills | Status: DC
Start: 2023-07-04 — End: 2024-02-14

## 2023-07-04 NOTE — Progress Notes (Signed)
E-Visit for Cough  We are sorry that you are not feeling well.  Here is how we plan to help!  Based on your presentation I believe you most likely have A cough due to bacteria.  When patients have a fever and a productive cough with a change in color or increased sputum production, we are concerned about bacterial bronchitis.  If left untreated it can progress to pneumonia.  If your symptoms do not improve with your treatment plan it is important that you contact your provider.   I have prescribed Doxycycline 100 mg twice a day for 7 days     In addition you may use A non-prescription cough medication called Robitussin DAC. Take 2 teaspoons every 8 hours or Delsym: take 2 teaspoons every 12 hours., A non-prescription cough medication called Mucinex DM: take 2 tablets every 12 hours., and A prescription cough medication called Tessalon Perles 100mg . You may take 1-2 capsules every 8 hours as needed for your cough.  Prednisone 10 mg daily for 6 days (see taper instructions below)  Directions for 6 day taper: Day 1: 2 tablets before breakfast, 1 after both lunch & dinner and 2 at bedtime Day 2: 1 tab before breakfast, 1 after both lunch & dinner and 2 at bedtime Day 3: 1 tab at each meal & 1 at bedtime Day 4: 1 tab at breakfast, 1 at lunch, 1 at bedtime Day 5: 1 tab at breakfast & 1 tab at bedtime Day 6: 1 tab at breakfast  From your responses in the eVisit questionnaire you describe inflammation in the upper respiratory tract which is causing a significant cough.  This is commonly called Bronchitis and has four common causes:   Allergies Viral Infections Acid Reflux Bacterial Infection Allergies, viruses and acid reflux are treated by controlling symptoms or eliminating the cause. An example might be a cough caused by taking certain blood pressure medications. You stop the cough by changing the medication. Another example might be a cough caused by acid reflux. Controlling the reflux helps  control the cough.  USE OF BRONCHODILATOR ("RESCUE") INHALERS: There is a risk from using your bronchodilator too frequently.  The risk is that over-reliance on a medication which only relaxes the muscles surrounding the breathing tubes can reduce the effectiveness of medications prescribed to reduce swelling and congestion of the tubes themselves.  Although you feel brief relief from the bronchodilator inhaler, your asthma may actually be worsening with the tubes becoming more swollen and filled with mucus.  This can delay other crucial treatments, such as oral steroid medications. If you need to use a bronchodilator inhaler daily, several times per day, you should discuss this with your provider.  There are probably better treatments that could be used to keep your asthma under control.     HOME CARE Only take medications as instructed by your medical team. Complete the entire course of an antibiotic. Drink plenty of fluids and get plenty of rest. Avoid close contacts especially the very young and the elderly Cover your mouth if you cough or cough into your sleeve. Always remember to wash your hands A steam or ultrasonic humidifier can help congestion.   GET HELP RIGHT AWAY IF: You develop worsening fever. You become short of breath You cough up blood. Your symptoms persist after you have completed your treatment plan MAKE SURE YOU  Understand these instructions. Will watch your condition. Will get help right away if you are not doing well or get worse.  Thank you for choosing an e-visit.  Your e-visit answers were reviewed by a board certified advanced clinical practitioner to complete your personal care plan. Depending upon the condition, your plan could have included both over the counter or prescription medications.  Please review your pharmacy choice. Make sure the pharmacy is open so you can pick up prescription now. If there is a problem, you may contact your provider through  [employer] and have the prescription routed to another pharmacy.  Your safety is important to [place]. If you have drug allergies check your prescription carefully.   For the next 24 hours you can use MyChart to ask questions about today's visit, request a non-urgent call back, or ask for a work or school excuse. You will get an email in the next two days asking about your experience. I hope that your e-visit has been valuable and will speed your recovery.  Approximately 5 minutes was spent documenting and reviewing patient's chart.

## 2023-07-09 NOTE — Progress Notes (Deleted)
[name] [address] [place] [address] Phone: [phone] Subjective:    I'm seeing this patient by the request  of:  Royal Hawthorn, NP  CC:   [id number]  06/14/2023 Patient took the Effexor 1 time and felt like she had nausea and discontinued it.  We discussed different medications and patient was very standoffish on it which she would potentially take and not take.  We discussed the potential referral as well to pain management to discuss naltrexone but states that she gets a runny nose with any type of nose spray.  We discussed with patient if she is not willing to try any treatment options do not know what we have available for this individual.  Discussed still the vitamin supplementation.  We did discuss that patient did have an elevated sedimentation rate and could refer her to rheumatology but she states that she has seen rheumatology multiple times without any significant diagnosis.  We did discuss with her about the abnormal MRI with the venous anomaly but does need to be seen by neurosurgeon.  Patient states she does not know if she will go through with this or not.  Discussed with patient that she can follow-up with me in 3 months if she wants to discuss further. Total time with patient as well as reviewing chart 32 minutes.  Patient also given Toradol and Depo-Medrol today.     Updated 07/12/2023 Tonya [name] is a 37 y.o. female coming in with complaint of polyarthralgia       Past Medical History:  Diagnosis Date   Abdominal bloating    Anemia    Fibromyalgia    Mild   GERD (gastroesophageal reflux disease)    Headache    "~ weekly" (03/27/2014)   Hemorrhoids    Hypokalemia    Insomnia    Migraines    "few times/year" (03/27/2014)   Thrombocytopenia (HCC)    Past Surgical History:  Procedure Laterality Date   DILATION AND CURETTAGE OF UTERUS  2006   EVALUATION UNDER ANESTHESIA WITH HEMORRHOIDECTOMY N/A 04/05/2019    Procedure: ANORECTAL EXAM UNDER ANESTHESIA WITH EXCISION OF ANAL CANAL POLYPOID TISSUE/PAPILLA;  Surgeon: Andria Meuse, MD;  Location: [facility];  Service: General;  Laterality: N/A;   UPPER GI ENDOSCOPY     Social History   Socioeconomic History   Marital status: Married    Spouse name: Not on file   Number of children: 1   Years of education: Not on file   Highest education level: Not on file  Occupational History   Occupation: manicurist    Employer: [employer]  Tobacco Use   Smoking status: Never   Smokeless tobacco: Never  Vaping Use   Vaping status: Never Used  Substance and Sexual Activity   Alcohol use: Not Currently    Alcohol/week: 3.0 standard drinks of alcohol    Types: 3 Glasses of wine per week   Drug use: No   Sexual activity: Yes    Birth control/protection: None  Other Topics Concern   Not on file  Social History Narrative   She moved to the [place] from [place] around age 58.   Social Determinants of Health   Financial Resource Strain: Not on file  Food Insecurity: Not on file  Transportation Needs: Not on file  Physical Activity: Not on file  Stress: Not on file  Social Connections: Not on file   No Known Allergies Family History  Problem Relation  Age of Onset   Hypertension Father    Hyperlipidemia Father    Gout Father    Colon cancer Mother        Dx at 84   Migraines Mother    Irritable bowel syndrome Mother    Seizures Brother    Irritable bowel syndrome Maternal Grandmother    Other Other        Patient does not know if her family has had heart trouble due to most of family being in [place] with less access to healthcare    Current Outpatient Medications (Endocrine & Metabolic):    predniSONE (STERAPRED UNI-PAK 21 TAB) 10 MG (21) TBPK tablet, Use as directed   Current Outpatient Medications (Respiratory):    albuterol (VENTOLIN HFA) 108 (90 Base) MCG/ACT inhaler, Inhale 2 puffs into the lungs every 6 (six)  hours as needed for wheezing or shortness of breath.   benzonatate (TESSALON PERLES) 100 MG capsule, Take 1 capsule (100 mg total) by mouth 3 (three) times daily as needed.    Current Outpatient Medications (Other):    clonazePAM (KLONOPIN) 0.5 MG tablet, 1/2 to 1 tab qhs for sleep   doxycycline (VIBRA-TABS) 100 MG tablet, Take 1 tablet (100 mg total) by mouth 2 (two) times daily.   esomeprazole (NEXIUM) 20 MG packet, Take 20 mg by mouth daily before breakfast.   venlafaxine XR (EFFEXOR XR) 37.5 MG 24 hr capsule, Take 1 capsule (37.5 mg total) by mouth daily with breakfast.   Vitamin D, Ergocalciferol, (DRISDOL) 1.25 MG (50000 UNIT) CAPS capsule, Take 1 capsule (50,000 Units total) by mouth every 7 (seven) days.   Reviewed prior external information including notes and imaging from  primary care provider As well as notes that were available from care everywhere and other healthcare systems.  Past medical history, social, surgical and family history all reviewed in electronic medical record.  No pertanent information unless stated regarding to the chief complaint.   Review of Systems:  No headache, visual changes, nausea, vomiting, diarrhea, constipation, dizziness, abdominal pain, skin rash, fevers, chills, night sweats, weight loss, swollen lymph nodes, body aches, joint swelling, chest pain, shortness of breath, mood changes. POSITIVE muscle aches  Objective  There were no vitals taken for this visit.   General: No apparent distress alert and oriented x3 mood and affect normal, dressed appropriately.  HEENT: Pupils equal, extraocular movements intact  Respiratory: Patient's speak in full sentences and does not appear short of breath  Cardiovascular: No lower extremity edema, non tender, no erythema      Impression and Recommendations:     The above documentation has been reviewed and is accurate and complete Judi Saa, DO

## 2023-07-12 ENCOUNTER — Ambulatory Visit: Payer: 59 | Admitting: Family Medicine

## 2024-02-14 ENCOUNTER — Ambulatory Visit (INDEPENDENT_AMBULATORY_CARE_PROVIDER_SITE_OTHER): Admitting: Sports Medicine

## 2024-02-14 VITALS — BP 108/72 | HR 81 | Ht 63.0 in | Wt 143.0 lb

## 2024-02-14 DIAGNOSIS — Q282 Arteriovenous malformation of cerebral vessels: Secondary | ICD-10-CM

## 2024-02-14 DIAGNOSIS — M9901 Segmental and somatic dysfunction of cervical region: Secondary | ICD-10-CM

## 2024-02-14 DIAGNOSIS — M2550 Pain in unspecified joint: Secondary | ICD-10-CM | POA: Diagnosis not present

## 2024-02-14 DIAGNOSIS — R519 Headache, unspecified: Secondary | ICD-10-CM | POA: Diagnosis not present

## 2024-02-14 DIAGNOSIS — M546 Pain in thoracic spine: Secondary | ICD-10-CM | POA: Diagnosis not present

## 2024-02-14 DIAGNOSIS — M9908 Segmental and somatic dysfunction of rib cage: Secondary | ICD-10-CM

## 2024-02-14 DIAGNOSIS — M629 Disorder of muscle, unspecified: Secondary | ICD-10-CM

## 2024-02-14 DIAGNOSIS — M9902 Segmental and somatic dysfunction of thoracic region: Secondary | ICD-10-CM

## 2024-02-14 DIAGNOSIS — M9905 Segmental and somatic dysfunction of pelvic region: Secondary | ICD-10-CM

## 2024-02-14 DIAGNOSIS — M542 Cervicalgia: Secondary | ICD-10-CM

## 2024-02-14 DIAGNOSIS — M9903 Segmental and somatic dysfunction of lumbar region: Secondary | ICD-10-CM

## 2024-02-14 DIAGNOSIS — G8929 Other chronic pain: Secondary | ICD-10-CM

## 2024-02-14 MED ORDER — MELOXICAM 15 MG PO TABS
15.0000 mg | ORAL_TABLET | Freq: Every day | ORAL | 0 refills | Status: AC
Start: 2024-02-14 — End: ?

## 2024-02-14 NOTE — Progress Notes (Signed)
 Ben Etter Royall D.[facility] [address] [place] [address] Phone: [phone]   Assessment and Plan:    1. Neck pain 2. Musculoskeletal disorder involving upper trapezius muscle 3. Polyarthralgia 6. Chronic bilateral thoracic back pain 7. Somatic dysfunction of cervical region 8. Somatic dysfunction of thoracic region 9. Somatic dysfunction of lumbar region 10. Somatic dysfunction of pelvic region 11. Somatic dysfunction of rib region -Chronic with exacerbation, subsequent visit - Patient presents with continued global pain that has been present for years.  She states that her neck pain, upper back and shoulder pain has worsened and is debilitating day-to-day.  Patient has unremarkable workup thus far including mildly elevated sed rate, but otherwise unremarkable autoimmune lab work and is seen rheumatology several times without definitive diagnosis. - Patient states she is medicine sensitive and has tried to avoid medications in the past, though due to her level of pain, she is willing to try medicine at this time.  Previously tried Effexor  for 1 dose and then discontinue medication after upset stomach. - Start meloxicam 15 mg daily x3 weeks.  Do not to use additional over-the-counter NSAIDs (ibuprofen , naproxen, Advil , Aleve, etc.) while taking prescription NSAIDs.  May use Tylenol  [phone] mg 2 to 3 times a day for breakthrough pain. - Start HEP and physical therapy targeting neck, trapezius, thoracic musculature - Patient has received relief with OMT in the past.  Elects for repeat OMT today.  Tolerated well per note below. - Decision today to treat with OMT was based on Physical Exam  After verbal consent patient was treated with HVLA (high velocity low amplitude), ME (muscle energy), FPR (flex positional release), ST (soft tissue), PC/PD (Pelvic Compression/ Pelvic Decompression) techniques in cervical, rib, thoracic, lumbar, and pelvic  areas. Patient tolerated the procedure well with improvement in symptoms.  Patient educated on potential side effects of soreness and recommended to rest, hydrate, and use Tylenol  as needed for pain control.     4. AVM (arteriovenous malformation) brain 5. 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  Patient has not established with neurosurgery and is not interested at neurosurgery referral at this time.  Pertinent previous records reviewed include prior sports medicine notes, brain MRI 05/30/2023  Follow Up: 4 weeks for reevaluation.  The patient found benefit from OMT, could consider repeat OMT.  If no improvement, could discuss x-ray imaging of areas of remaining pain   Subjective:   I, Moenique Parris, am serving as a Neurosurgeon for Doctor Ulysees Gander  Chief Complaint: neck and shoulder pain   HPI:  05/03/2023 Out of proportion to what would be anticipated.  Does have some difficulty with seated to standing.  Increasing neck pain.  Some signs and symptoms with the dizziness and lightheadedness that would be concerning for potential intracranial hypertension.  Would like to get MRI to further evaluate with patient having this for quite some time and being disabling to a certain degree.  Follow-up with me after the MRI to discuss further treatment options.    Started Effexor  today. Multiple different complaints over the years.  Has had some difficulty with iron deficiency in the past when reviewing patient's labs.  Discussed with patient about icing regimen and home exercises, discussed which activities to do and which he has to avoid.  We discussed with  patient that depending on laboratory workup went to have different treatment options could be beneficial.  Warned of side  effects of the Effexor  but I do think will help with some of the pain as well as some of the anxiety as well.  Follow-up with me again in 6 to 8 weeks.   Update 06/14/2023 Takia Shafran is a 38 y.o. female coming in with complaint of polyarthralgia. Patient referred to neurosurgery after MRI. Patient states cough and severe chest congestion the last couple of weeks. Go over MRI and lab work. Vaginal odor and itchiness.   MRI brain 05/30/2023 IMPRESSION: 1.  No evidence of an acute intracranial abnormality. 2. 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 3. Otherwise unremarkable non-contrast MRI appearance of the brain. 4. Mild paranasal sinus mucosal thickening.  02/14/24 Patient is a 38 year old female with neck and shoulder pain. Patient states for past 3 month her pain has been constant and increased. Pain through out the body. Is interested in OMT and PT    Relevant Historical Information: None pertinent  Additional pertinent review of systems negative.   Current Outpatient Medications:    meloxicam (MOBIC) 15 MG tablet, Take 1 tablet (15 mg total) by mouth daily., Disp: 30 tablet, Rfl: 0   albuterol  (VENTOLIN  HFA) 108 (90 Base) MCG/ACT inhaler, Inhale 2 puffs into the lungs every 6 (six) hours as needed for wheezing or shortness of breath., Disp: 8 g, Rfl: 0   Vitamin D , Ergocalciferol , (DRISDOL ) 1.25 MG (50000 UNIT) CAPS capsule, Take 1 capsule (50,000 Units total) by mouth every 7 (seven) days., Disp: 12 capsule, Rfl: 0   Objective:     Vitals:   02/14/24 1038  BP: 108/72  Pulse: 81  SpO2: 98%  Weight: 143 lb (64.9 kg)  Height: 5\' 3"  (1.6 m)      Body mass index is 25.33 kg/m.    Physical Exam:    General: Well-appearing, cooperative, sitting comfortably in no acute distress.   OMT Physical Exam:  In general, pain out of proportion to physical exam ASIS Compression Test: Positive Right Cervical:  TTP paraspinal, T3 RLSR Rib: Bilateral elevated first rib with TTP, worse on left Thoracic: TTP paraspinal, T4-7 RRSL Lumbar: TTP paraspinal, L1-3 RRSL Pelvis: Right anterior innominate with outflare   Electronically signed by:  Marshall Skeeter D.[facility] 12:48 PM 02/14/24

## 2024-02-14 NOTE — Patient Instructions (Signed)
-   Start meloxicam 15 mg daily x3 weeks.  May use remaining NSAID as needed once daily for pain control.  Do not to use additional over-the-counter NSAIDs (ibuprofen , naproxen, Advil , Aleve, etc.) while taking prescription NSAIDs.  May use Tylenol  [phone] mg 2 to 3 times a day for breakthrough pain. Neck and trap HEP  PT referral  4 week follow up

## 2024-03-10 NOTE — Progress Notes (Deleted)
    [name] [address] [place] [address] Phone: [phone]   Assessment and Plan:     There are no diagnoses linked to this encounter.  ***   Pertinent previous records reviewed include ***    Follow Up: ***     Subjective:   I, Tonya [name], am serving as a Neurosurgeon for Doctor Ulysees Gander   Chief Complaint: neck and shoulder pain    HPI:  05/03/2023 Out of proportion to what would be anticipated.  Does have some difficulty with seated to standing.  Increasing neck pain.  Some signs and symptoms with the dizziness and lightheadedness that would be concerning for potential intracranial hypertension.  Would like to get MRI to further evaluate with patient having this for quite some time and being disabling to a certain degree.  Follow-up with me after the MRI to discuss further treatment options.     Started Effexor  today. Multiple different complaints over the years.  Has had some difficulty with iron deficiency in the past when reviewing patient's labs.  Discussed with patient about icing regimen and home exercises, discussed which activities to do and which he has to avoid.  We discussed with patient that depending on laboratory workup went to have different treatment options could be beneficial.  Warned of side effects of the Effexor  but I do think will help with some of the pain as well as some of the anxiety as well.  Follow-up with me again in 6 to 8 weeks.   Update 06/14/2023 Tonya [name] is a 38 y.o. female coming in with complaint of polyarthralgia. Patient referred to neurosurgery after MRI. Patient states cough and severe chest congestion the last couple of weeks. Go over MRI and lab work. Vaginal odor and itchiness.   MRI brain 05/30/2023 IMPRESSION: 1.  No evidence of an acute intracranial abnormality. 2. 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 3. Otherwise unremarkable non-contrast MRI appearance of the brain. 4. Mild paranasal sinus mucosal thickening.   02/14/24 Patient is a 38 year old female with neck and shoulder pain. Patient states for past 3 month her pain has been constant and increased. Pain through out the body. Is interested in OMT and PT   03/13/2024 Patient states   Relevant Historical Information: None pertinent  Additional pertinent review of systems negative.   Current Outpatient Medications:    albuterol  (VENTOLIN  HFA) 108 (90 Base) MCG/ACT inhaler, Inhale 2 puffs into the lungs every 6 (six) hours as needed for wheezing or shortness of breath., Disp: 8 g, Rfl: 0   meloxicam  (MOBIC ) 15 MG tablet, Take 1 tablet (15 mg total) by mouth daily., Disp: 30 tablet, Rfl: 0   Vitamin D , Ergocalciferol , (DRISDOL ) 1.25 MG (50000 UNIT) CAPS capsule, Take 1 capsule (50,000 Units total) by mouth every 7 (seven) days., Disp: 12 capsule, Rfl: 0   Objective:     There were no vitals filed for this visit.    There is no height or weight on file to calculate BMI.    Physical Exam:    ***   Electronically signed by:  Marshall Skeeter D.[facility] 8:32 AM 03/10/24

## 2024-03-13 ENCOUNTER — Ambulatory Visit: Admitting: Sports Medicine

## 2024-03-20 ENCOUNTER — Telehealth: Payer: Self-pay | Admitting: Sports Medicine

## 2024-03-20 DIAGNOSIS — M9901 Segmental and somatic dysfunction of cervical region: Secondary | ICD-10-CM

## 2024-03-20 DIAGNOSIS — M9902 Segmental and somatic dysfunction of thoracic region: Secondary | ICD-10-CM

## 2024-03-20 DIAGNOSIS — R519 Headache, unspecified: Secondary | ICD-10-CM

## 2024-03-20 DIAGNOSIS — M629 Disorder of muscle, unspecified: Secondary | ICD-10-CM

## 2024-03-20 DIAGNOSIS — M2550 Pain in unspecified joint: Secondary | ICD-10-CM

## 2024-03-20 DIAGNOSIS — G8929 Other chronic pain: Secondary | ICD-10-CM

## 2024-03-20 DIAGNOSIS — M9903 Segmental and somatic dysfunction of lumbar region: Secondary | ICD-10-CM

## 2024-03-20 DIAGNOSIS — M542 Cervicalgia: Secondary | ICD-10-CM

## 2024-03-20 DIAGNOSIS — M9908 Segmental and somatic dysfunction of rib cage: Secondary | ICD-10-CM

## 2024-03-20 DIAGNOSIS — Q282 Arteriovenous malformation of cerebral vessels: Secondary | ICD-10-CM

## 2024-03-20 DIAGNOSIS — M9905 Segmental and somatic dysfunction of pelvic region: Secondary | ICD-10-CM

## 2024-03-20 MED ORDER — MELOXICAM 15 MG PO TABS: 15.0000 mg | ORAL_TABLET | Freq: Every day | ORAL | 0 refills | Status: AC

## 2024-03-20 NOTE — Telephone Encounter (Signed)
 Ordered

## 2024-03-20 NOTE — Telephone Encounter (Signed)
 Dr Cleora Daft patient -  Patient called requesting a refill on: meloxicam  (MOBIC ) 15 MG tablet   Pharmacy: [facility]

## 2024-04-11 ENCOUNTER — Encounter: Payer: Self-pay | Admitting: Sports Medicine
